# Patient Record
Sex: Female | Born: 1975 | Race: White | Hispanic: No | Marital: Married | Smoking: Current every day smoker
Health system: Southern US, Community
[De-identification: ages and names within clinical notes are randomized; demographics above are authoritative.]

## PROBLEM LIST (undated history)

## (undated) DIAGNOSIS — Z21 Asymptomatic human immunodeficiency virus [HIV] infection status: Secondary | ICD-10-CM

## (undated) DIAGNOSIS — B20 Human immunodeficiency virus [HIV] disease: Secondary | ICD-10-CM

## (undated) DIAGNOSIS — F4310 Post-traumatic stress disorder, unspecified: Secondary | ICD-10-CM

## (undated) DIAGNOSIS — J189 Pneumonia, unspecified organism: Secondary | ICD-10-CM

## (undated) DIAGNOSIS — F909 Attention-deficit hyperactivity disorder, unspecified type: Secondary | ICD-10-CM

## (undated) DIAGNOSIS — F1990 Other psychoactive substance use, unspecified, uncomplicated: Secondary | ICD-10-CM

## (undated) DIAGNOSIS — B1910 Unspecified viral hepatitis B without hepatic coma: Secondary | ICD-10-CM

## (undated) DIAGNOSIS — I10 Essential (primary) hypertension: Secondary | ICD-10-CM

## (undated) DIAGNOSIS — B9689 Other specified bacterial agents as the cause of diseases classified elsewhere: Secondary | ICD-10-CM

---

## 2019-01-15 ENCOUNTER — Encounter (HOSPITAL_COMMUNITY): Payer: Self-pay

## 2019-01-15 ENCOUNTER — Emergency Department (HOSPITAL_COMMUNITY)
Admission: EM | Admit: 2019-01-15 | Discharge: 2019-01-15 | Disposition: A | Discharge status: Home / Self Care | Payer: Medicaid Other | Attending: Emergency Medicine | Admitting: Emergency Medicine

## 2019-01-15 ENCOUNTER — Other Ambulatory Visit: Payer: Self-pay

## 2019-01-15 ENCOUNTER — Emergency Department (HOSPITAL_COMMUNITY): Payer: Medicaid Other

## 2019-01-15 DIAGNOSIS — S62619S Displaced fracture of proximal phalanx of unspecified finger, sequela: Secondary | ICD-10-CM

## 2019-01-15 DIAGNOSIS — S62617S Displaced fracture of proximal phalanx of left little finger, sequela: Secondary | ICD-10-CM | POA: Diagnosis not present

## 2019-01-15 HISTORY — DX: Human immunodeficiency virus (HIV) disease: B20

## 2019-01-15 HISTORY — DX: Asymptomatic human immunodeficiency virus (hiv) infection status: Z21

## 2019-01-15 MED ORDER — IBUPROFEN 400 MG PO TABS
600.0000 mg | ORAL_TABLET | Freq: Once | ORAL | Status: AC
  Administered 2019-01-15: 600 mg via ORAL
  Filled 2019-01-15: qty 1

## 2019-01-15 MED ORDER — OXYCODONE-ACETAMINOPHEN 5-325 MG PO TABS
1.0000 | ORAL_TABLET | Freq: Once | ORAL | Status: AC
  Administered 2019-01-15: 1 via ORAL
  Filled 2019-01-15: qty 1

## 2019-01-15 NOTE — ED Notes (Signed)
Pt discharge instructions and follow up instructions reviewed with the patient. The patient verbalized understanding. Pt discharged.
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
## 2019-01-15 NOTE — Discharge Instructions (Signed)
You are seen in the ER for a recheck of your left hand fracture.  X-ray showed a slightly displaced fracture at the base of your fifth finger.  No other complications.  Orthopedic physician assistant evaluated you in the ER and recommended splinting to keep the fracture stable.  Alternate ibuprofen and acetaminophen for pain.  Keep the extremity elevated.  There is a little bit of skin breakdown in between your fingers which is due to moisture and having the splint on for several days.  Because of this and to prevent an infection, we placed you in a splint that is removable at home so that you can check the skin to make sure that there is no infection developing.  Wear your splint at all times but do keep a close eye on the skin to make sure there is no infection.  Return to the ER if there is increased redness pain swelling pus fevers around the skin, loss of sensation or tingling in your extremity.  Call orthopedic/hand clinic to make an appointment for reevaluation to ensure your fracture is healing appropriately.
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
## 2019-01-15 NOTE — Progress Notes (Signed)
Orthopedic Tech Progress Note Patient Details:  Alyssa Barnett [facility] 22-May-1975 [id number] Had a conversation with the PA about making the splint removable so patient could keep an eye on her hand. She had wet, soft skin in between fingers where she originally had her fingers "buddy taped".[facility] Pas[employer] Devices Type of Ortho Device: Ulna gutter splint Ortho Device/Splint Location: LUE Ortho Device/Splint Interventions: Application, Ordered   Post Interventions Patient Tolerated: Well Instructions Provided: Care of device, Adjustment of device   Janit Pagan 01/15/2019, 4:44 PM
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
## 2019-01-15 NOTE — ED Triage Notes (Signed)
MD at bedside. 

## 2019-10-01 ENCOUNTER — Encounter (HOSPITAL_COMMUNITY): Payer: Self-pay | Admitting: Emergency Medicine

## 2019-10-01 ENCOUNTER — Other Ambulatory Visit: Payer: Self-pay

## 2019-10-01 ENCOUNTER — Emergency Department (HOSPITAL_COMMUNITY): Payer: Medicaid Other

## 2019-10-01 ENCOUNTER — Emergency Department (HOSPITAL_COMMUNITY)
Admission: EM | Admit: 2019-10-01 | Discharge: 2019-10-01 | Discharge status: Against Medical Advice | Payer: Medicaid Other | Attending: Emergency Medicine | Admitting: Emergency Medicine

## 2019-10-01 DIAGNOSIS — F17210 Nicotine dependence, cigarettes, uncomplicated: Secondary | ICD-10-CM | POA: Diagnosis not present

## 2019-10-01 DIAGNOSIS — J189 Pneumonia, unspecified organism: Secondary | ICD-10-CM | POA: Diagnosis not present

## 2019-10-01 DIAGNOSIS — R079 Chest pain, unspecified: Secondary | ICD-10-CM

## 2019-10-01 DIAGNOSIS — R7989 Other specified abnormal findings of blood chemistry: Secondary | ICD-10-CM | POA: Diagnosis not present

## 2019-10-01 DIAGNOSIS — Z20822 Contact with and (suspected) exposure to covid-19: Secondary | ICD-10-CM | POA: Insufficient documentation

## 2019-10-01 LAB — CBC
HCT: 39.4 % (ref 36.0–46.0)
Hemoglobin: 12.1 g/dL (ref 12.0–15.0)
MCH: 26.4 pg (ref 26.0–34.0)
MCHC: 30.7 g/dL (ref 30.0–36.0)
MCV: 86 fL (ref 80.0–100.0)
Platelets: 127 10*3/uL — ABNORMAL LOW (ref 150–400)
RBC: 4.58 MIL/uL (ref 3.87–5.11)
RDW: 14.7 % (ref 11.5–15.5)
WBC: 3.2 10*3/uL — ABNORMAL LOW (ref 4.0–10.5)
nRBC: 0 % (ref 0.0–0.2)

## 2019-10-01 LAB — BASIC METABOLIC PANEL
Anion gap: 9 (ref 5–15)
BUN: 15 mg/dL (ref 6–20)
CO2: 22 mmol/L (ref 22–32)
Calcium: 8.4 mg/dL — ABNORMAL LOW (ref 8.9–10.3)
Chloride: 105 mmol/L (ref 98–111)
Creatinine, Ser: 1.19 mg/dL — ABNORMAL HIGH (ref 0.44–1.00)
GFR calc Af Amer: >60 mL/min (ref >60)
GFR calc non Af Amer: 56 mL/min — ABNORMAL LOW (ref >60)
Glucose, Bld: 128 mg/dL — ABNORMAL HIGH (ref 70–99)
Potassium: 3.8 mmol/L (ref 3.5–5.1)
Sodium: 136 mmol/L (ref 135–145)

## 2019-10-01 LAB — TROPONIN I (HIGH SENSITIVITY)
Troponin I (High Sensitivity): 34 ng/L — ABNORMAL HIGH (ref <18)
Troponin I (High Sensitivity): 45 ng/L — ABNORMAL HIGH (ref <18)

## 2019-10-01 LAB — SARS CORONAVIRUS 2 BY RT PCR (HOSPITAL ORDER, PERFORMED IN ~~LOC~~ HOSPITAL LAB): SARS Coronavirus 2: NEGATIVE

## 2019-10-01 LAB — D-DIMER, QUANTITATIVE: D-Dimer, Quant: 1 ug/mL-FEU — ABNORMAL HIGH (ref 0.00–0.50)

## 2019-10-01 LAB — I-STAT BETA HCG BLOOD, ED (MC, WL, AP ONLY): I-stat hCG, quantitative: <5 m[IU]/mL (ref <5)

## 2019-10-01 MED ORDER — SODIUM CHLORIDE 0.9 % IV SOLN
1.0000 g | Freq: Once | INTRAVENOUS | Status: AC
  Administered 2019-10-01: 1 g via INTRAVENOUS
  Filled 2019-10-01: qty 10

## 2019-10-01 MED ORDER — OXYCODONE-ACETAMINOPHEN 5-325 MG PO TABS
1.0000 | ORAL_TABLET | Freq: Once | ORAL | Status: AC
  Administered 2019-10-01: 1 via ORAL
  Filled 2019-10-01: qty 1

## 2019-10-01 MED ORDER — AZITHROMYCIN 250 MG PO TABS
500.0000 mg | ORAL_TABLET | Freq: Once | ORAL | Status: AC
  Administered 2019-10-01: 500 mg via ORAL
  Filled 2019-10-01: qty 2

## 2019-10-01 MED ORDER — ENOXAPARIN SODIUM 60 MG/0.6ML ~~LOC~~ SOLN
55.0000 mg | Freq: Two times a day (BID) | SUBCUTANEOUS | Status: DC
  Filled 2019-10-01: qty 0.55

## 2019-10-01 MED ORDER — MORPHINE SULFATE (PF) 4 MG/ML IV SOLN
4.0000 mg | Freq: Once | INTRAVENOUS | Status: AC
  Administered 2019-10-01: 4 mg via INTRAVENOUS
  Filled 2019-10-01: qty 1

## 2019-10-01 MED ORDER — HYDROMORPHONE HCL 1 MG/ML IJ SOLN
1.0000 mg | Freq: Once | INTRAMUSCULAR | Status: AC
  Administered 2019-10-01: 1 mg via INTRAVENOUS
  Filled 2019-10-01: qty 1

## 2019-10-01 NOTE — ED Provider Notes (Signed)
[facility] EMERGENCY DEPARTMENT Provider Note   CSN: [id number] Arrival date & time: 10/01/19  5397     History Chief Complaint  Patient presents with  . Chest Pain    Alyssa Barnett is a 44 y.o. female.  HPI   Pt is complaining of pain in her chest and lungs.  It started about a week ago.  Pt thinks she has pneumonia.  SHe has felt feverish.  Has not taken her temp.  Pt is HIV, aids defined.  SHe is not on any medications.  Pt is not sure of recent viral load or cd4 count.    Pt has not been vaccinated for covid.  Pt states the pain in her chest is severe.  It increases with coughing.  Past Medical History:  Diagnosis Date  . HIV (human immunodeficiency virus infection) (HCC)     There are no problems to display for this patient.   History reviewed. No pertinent surgical history.   OB History   No obstetric history on file.     No family history on file.  Social History   Tobacco Use  . Smoking status: Current Every Day Smoker  . Smokeless tobacco: Never Used  Vaping Use  . Vaping Use: Never used  Substance Use Topics  . Alcohol use: Yes  . Drug use: Yes    Types: Marijuana    Home Medications Prior to Admission medications   Not on File    Allergies    Patient has no known allergies.  Review of Systems   Review of Systems  All other systems reviewed and are negative.   Physical Exam Updated Vital Signs BP (!) 136/94 (BP Location: Right Arm)   Pulse 100   Temp (!) 97.2 F (36.2 C) (Oral)   Resp 18   Ht 1.626 m (5\' 4" )   Wt 56.7 kg   SpO2 91%   BMI 21.46 kg/m   Physical Exam Vitals and nursing note reviewed.  Constitutional:      General: She is not in acute distress.    Appearance: She is normal weight.  HENT:     Head: Normocephalic and atraumatic.     Right Ear: External ear normal.     Left Ear: External ear normal.  Eyes:     General: No scleral icterus.       Right eye: No discharge.        Left eye:  No discharge.     Conjunctiva/sclera: Conjunctivae normal.  Neck:     Trachea: No tracheal deviation.  Cardiovascular:     Rate and Rhythm: Normal rate and regular rhythm.  Pulmonary:     Effort: Pulmonary effort is normal. No respiratory distress.     Breath sounds: Normal breath sounds. No stridor. No decreased breath sounds, wheezing or rales.  Chest:     Chest wall: No edema.  Abdominal:     General: Bowel sounds are normal. There is no distension.     Palpations: Abdomen is soft.     Tenderness: There is no abdominal tenderness. There is no guarding or rebound.  Musculoskeletal:        General: No tenderness.     Cervical back: Neck supple.     Right lower leg: No edema.     Left lower leg: No edema.  Skin:    General: Skin is warm and dry.     Findings: No rash.  Neurological:     Mental Status:  She is alert.     Cranial Nerves: No cranial nerve deficit (no facial droop, extraocular movements intact, no slurred speech).     Sensory: No sensory deficit.     Motor: No abnormal muscle tone or seizure activity.     Coordination: Coordination normal.  Psychiatric:        Mood and Affect: Affect is labile and angry.     Comments: Initially did not answer questions, as I spoke louder pt responded but indicated I was speaking too loudly     ED Results / Procedures / Treatments   Labs (all labs ordered are listed, but only abnormal results are displayed) Labs Reviewed  BASIC METABOLIC PANEL - Abnormal; Notable for the following components:      Result Value   Glucose, Bld 128 (*)    Creatinine, Ser 1.19 (*)    Calcium 8.4 (*)    GFR calc non Af Amer 56 (*)    All other components within normal limits  CBC - Abnormal; Notable for the following components:   WBC 3.2 (*)    Platelets 127 (*)    All other components within normal limits  D-DIMER, QUANTITATIVE (NOT AT [facility]) - Abnormal; Notable for the following components:   D-Dimer, Quant 1.00 (*)    All other components  within normal limits  TROPONIN I (HIGH SENSITIVITY) - Abnormal; Notable for the following components:   Troponin I (High Sensitivity) 34 (*)    All other components within normal limits  TROPONIN I (HIGH SENSITIVITY) - Abnormal; Notable for the following components:   Troponin I (High Sensitivity) 45 (*)    All other components within normal limits  SARS CORONAVIRUS 2 BY RT PCR (HOSPITAL ORDER, PERFORMED IN [facility])  I-STAT BETA HCG BLOOD, ED ([facility], [facility], [facility] ONLY)    EKG EKG Interpretation  Date/Time:  Monday October 01 2019 11:42:25 EDT Ventricular Rate:  91 PR Interval:    QRS Duration: 100 QT Interval:  405 QTC Calculation: 499 R Axis:   101 Text Interpretation: Sinus rhythm Atrial premature complex Probable left atrial enlargement Right axis deviation Abnormal lateral Q waves Minimal ST depression, lateral leads No previous tracing Confirmed by Linwood Dibbles [phone]) on 10/01/2019 12:10:34 PM   Radiology DG Chest 2 View  Result Date: 10/01/2019 CLINICAL DATA:  Chest wall pain, confusion EXAM: CHEST - 2 VIEW COMPARISON:  None. FINDINGS: Mild enlargement of the cardiopericardial silhouette. Normal mediastinal contour. No pneumothorax. No pleural effusion. No overt pulmonary edema. Patchy hazy and reticular opacities in the lower lungs bilaterally. IMPRESSION: 1. Patchy hazy and reticular opacities in the lower lungs bilaterally, suspect nonspecific scarring, cannot exclude superimposed atypical infection. 2. Mild enlargement of the cardiopericardial silhouette. Electronically Signed   By: Delbert Phenix M.D.   On: 10/01/2019 09:16    Procedures Procedures (including critical care time)  Medications Ordered in ED Medications  enoxaparin (LOVENOX) injection 55 mg (has no administration in time range)  oxyCODONE-acetaminophen (PERCOCET/ROXICET) 5-325 MG per tablet 1 tablet (1 tablet Oral Given 10/01/19 1117)  cefTRIAXone (ROCEPHIN) 1 g in sodium chloride 0.9 % 100 mL IVPB (0 g  Intravenous Stopped 10/01/19 1315)  azithromycin (ZITHROMAX) tablet 500 mg (500 mg Oral Given 10/01/19 1229)  morphine 4 MG/ML injection 4 mg (4 mg Intravenous Given 10/01/19 1256)  HYDROmorphone (DILAUDID) injection 1 mg (1 mg Intravenous Given 10/01/19 1447)    ED Course  I have reviewed the triage vital signs and the nursing notes.  Pertinent  labs & imaging results that were available during my care of the patient were reviewed by me and considered in my medical decision making (see chart for details).  Clinical Course as of Oct 01 1543  [facility] Oct 01, 2019  1051 Labs reviewed.  Patient does have an elevated troponin at 34.  Symptoms are not typical for ACS.  12-week peak troponin check a D-dimer.   [JK]  1052 Chest x-ray does findings suggestive of possible atypical infection.   [JK]  1211 Notified of decreased o2 sat.  Pt started on [place]   [JK]  1326 D-dimer elevated at 1.  I will order a CT angiogram   [JK]  1445 Pt would not lie still for CT scan.  Pt started yelling at me when I tried to discuss her CT scan.  Pt eventually calmed down and agrees to try after additional pain medication   [JK]  1446 Patient was unable to lie still for the CT scan even after being given additional pain medications   [JK]  1535 I recommended admission to the hospital.  However patient states she has to go to her hotel room to pick up her stuff.  She does not want to be admitted now but states she will come back after she gets her stuff from the hotel.  Patient called her father to get some money so she could go to the hotel and I remained at the bedside to speak to her father about [place] wanting her to be admitted to the hospital   [JK]    Clinical Course User Index [JK] Linwood Dibbles, MD   MDM Rules/Calculators/A&P                          Patient presented to ED with complaints of chest pain and she was concerned she had pneumonia.  Patient does have HIV disease and has not been compliant with an HIV  regimen.  Patient's Covid test is negative.  She did have an elevated D-dimer and at times has required oxygen in the ED.  I was concerned about the possibility of pulmonary embolism.  I ordered CT angiogram.  Patient was unable to lie still despite IV narcotics.  I recommend admitting the patient to the hospital for further treatment.  Patient is adamant that she cannot stay right now.  All her possessions are in her motel room and she needs to go pick them up.  Patient states she will return as soon as she does that.  I will give the patient a dose of Lovenox to cover since she will not stay for admission at this time.  Patient is alert and awake and answering questions appropriately.  She understands the risks and dangers.  She will sign out AMA but plans on returning Final Clinical Impression(s) / ED Diagnoses Final diagnoses:  Community acquired pneumonia, unspecified laterality  Chest pain, unspecified type  Positive D dimer    Rx / DC Orders ED Discharge Orders    None       Linwood Dibbles, MD 10/01/19 1546

## 2019-10-01 NOTE — ED Notes (Signed)
Attempted to do EKG, pt moving around too much, pt yelling out in pain at this time, unable to put pt on cardiac monitor, insert IV, or obtain COVID swab

## 2019-10-01 NOTE — ED Triage Notes (Signed)
To ED via [facility] from [facility] on [address]- c/o chest pain that has been going on for A MONTH - states "just can't take it anymore. Rocking back and forth in chair.

## 2019-10-01 NOTE — ED Notes (Signed)
Pt states she is unable to stay at this time, needs to organize her belongings/living situation. States she understands that she needs to be admitted and will return. Pt ordered [employer] and left AMA. Provider aware.

## 2019-10-01 NOTE — Discharge Instructions (Signed)
Return to the ED to have your CT scan and possibly be admitted to the hospital

## 2019-10-01 NOTE — ED Notes (Signed)
Pt unable to lay flat for CT scan. Provider aware.

## 2019-10-04 ENCOUNTER — Emergency Department (HOSPITAL_COMMUNITY)
Admission: EM | Admit: 2019-10-04 | Discharge: 2019-10-04 | Disposition: A | Discharge status: Home / Self Care | Payer: Medicaid Other | Attending: Emergency Medicine | Admitting: Emergency Medicine

## 2019-10-04 ENCOUNTER — Emergency Department (HOSPITAL_COMMUNITY): Payer: Medicaid Other

## 2019-10-04 ENCOUNTER — Other Ambulatory Visit: Payer: Self-pay

## 2019-10-04 ENCOUNTER — Encounter (HOSPITAL_COMMUNITY): Payer: Self-pay

## 2019-10-04 DIAGNOSIS — R0602 Shortness of breath: Secondary | ICD-10-CM | POA: Diagnosis not present

## 2019-10-04 DIAGNOSIS — Z5321 Procedure and treatment not carried out due to patient leaving prior to being seen by health care provider: Secondary | ICD-10-CM | POA: Insufficient documentation

## 2019-10-04 DIAGNOSIS — R079 Chest pain, unspecified: Secondary | ICD-10-CM | POA: Insufficient documentation

## 2019-10-04 LAB — CBC
HCT: 37.4 % (ref 36.0–46.0)
Hemoglobin: 11.4 g/dL — ABNORMAL LOW (ref 12.0–15.0)
MCH: 26.5 pg (ref 26.0–34.0)
MCHC: 30.5 g/dL (ref 30.0–36.0)
MCV: 87 fL (ref 80.0–100.0)
Platelets: 122 10*3/uL — ABNORMAL LOW (ref 150–400)
RBC: 4.3 MIL/uL (ref 3.87–5.11)
RDW: 14.6 % (ref 11.5–15.5)
WBC: 2.5 10*3/uL — ABNORMAL LOW (ref 4.0–10.5)
nRBC: 0 % (ref 0.0–0.2)

## 2019-10-04 LAB — BASIC METABOLIC PANEL
Anion gap: 7 (ref 5–15)
BUN: 14 mg/dL (ref 6–20)
CO2: 23 mmol/L (ref 22–32)
Calcium: 8.2 mg/dL — ABNORMAL LOW (ref 8.9–10.3)
Chloride: 103 mmol/L (ref 98–111)
Creatinine, Ser: 1.26 mg/dL — ABNORMAL HIGH (ref 0.44–1.00)
GFR calc Af Amer: >60 mL/min (ref >60)
GFR calc non Af Amer: 53 mL/min — ABNORMAL LOW (ref >60)
Glucose, Bld: 153 mg/dL — ABNORMAL HIGH (ref 70–99)
Potassium: 3.7 mmol/L (ref 3.5–5.1)
Sodium: 133 mmol/L — ABNORMAL LOW (ref 135–145)

## 2019-10-04 LAB — TROPONIN I (HIGH SENSITIVITY): Troponin I (High Sensitivity): 31 ng/L — ABNORMAL HIGH (ref <18)

## 2019-10-04 LAB — I-STAT BETA HCG BLOOD, ED (MC, WL, AP ONLY): I-stat hCG, quantitative: <5 m[IU]/mL (ref <5)

## 2019-10-04 NOTE — ED Triage Notes (Addendum)
Pt presents witgh CP, states she was just here and dx with "walking pneumonia" also reports she was supposed to be admitted 2 days ago when for the same. Pt states she couldn't stay d/t "living situation" until today.   Pt states she "last smoked crack and smoked weed 2 days ago"

## 2019-10-04 NOTE — ED Notes (Addendum)
Lobby assistant, Derinda C, RTR approached pt to check VS which patient refused stating she was leaving. Pt left with her belongings bag. Pt stated intention to receive treatment at another local hospital.

## 2019-10-11 ENCOUNTER — Emergency Department (HOSPITAL_COMMUNITY)
Admission: EM | Admit: 2019-10-11 | Discharge: 2019-10-12 | Disposition: A | Discharge status: Home / Self Care | Payer: Medicaid Other | Attending: Emergency Medicine | Admitting: Emergency Medicine

## 2019-10-11 ENCOUNTER — Encounter (HOSPITAL_COMMUNITY): Payer: Self-pay

## 2019-10-11 DIAGNOSIS — R109 Unspecified abdominal pain: Secondary | ICD-10-CM | POA: Insufficient documentation

## 2019-10-11 DIAGNOSIS — Z5321 Procedure and treatment not carried out due to patient leaving prior to being seen by health care provider: Secondary | ICD-10-CM | POA: Diagnosis not present

## 2019-10-11 NOTE — ED Triage Notes (Signed)
Pt arrives to ED via [facility] w/ c/o bilat flank pain that started today. Pt denies urinary symptoms, denies fever. EMS initially called out for resp distress, resp e/u, pt able to speak in complete sentences. Pt reports dx of walking PNA on 8/30. Probable substance abuse per EMS, w/ hx of crack/cocaine use. EMS VSS.

## 2019-10-11 NOTE — ED Notes (Signed)
Refuses VS assessment. States she is leaving.

## 2019-10-11 NOTE — ED Notes (Signed)
Pt refusing blood work in triage on account that she was told she could not lay on the floor in the triage room.

## 2019-10-16 ENCOUNTER — Other Ambulatory Visit: Payer: Self-pay

## 2019-10-16 ENCOUNTER — Inpatient Hospital Stay (HOSPITAL_COMMUNITY): Payer: Medicaid Other

## 2019-10-16 ENCOUNTER — Emergency Department (HOSPITAL_COMMUNITY): Payer: Medicaid Other

## 2019-10-16 ENCOUNTER — Inpatient Hospital Stay (HOSPITAL_COMMUNITY)
Admission: EM | Admit: 2019-10-16 | Discharge: 2019-10-22 | DRG: 974 | Disposition: A | Discharge status: Home / Self Care | Payer: Medicaid Other | Attending: Internal Medicine | Admitting: Internal Medicine

## 2019-10-16 ENCOUNTER — Encounter (HOSPITAL_COMMUNITY): Payer: Self-pay | Admitting: Emergency Medicine

## 2019-10-16 DIAGNOSIS — I501 Left ventricular failure: Secondary | ICD-10-CM | POA: Diagnosis not present

## 2019-10-16 DIAGNOSIS — I110 Hypertensive heart disease with heart failure: Secondary | ICD-10-CM | POA: Diagnosis present

## 2019-10-16 DIAGNOSIS — W19XXXA Unspecified fall, initial encounter: Secondary | ICD-10-CM

## 2019-10-16 DIAGNOSIS — Z8701 Personal history of pneumonia (recurrent): Secondary | ICD-10-CM

## 2019-10-16 DIAGNOSIS — R188 Other ascites: Secondary | ICD-10-CM | POA: Diagnosis present

## 2019-10-16 DIAGNOSIS — F1423 Cocaine dependence with withdrawal: Secondary | ICD-10-CM | POA: Diagnosis present

## 2019-10-16 DIAGNOSIS — Z20822 Contact with and (suspected) exposure to covid-19: Secondary | ICD-10-CM | POA: Diagnosis present

## 2019-10-16 DIAGNOSIS — J17 Pneumonia in diseases classified elsewhere: Secondary | ICD-10-CM | POA: Diagnosis present

## 2019-10-16 DIAGNOSIS — I5082 Biventricular heart failure: Secondary | ICD-10-CM | POA: Diagnosis present

## 2019-10-16 DIAGNOSIS — I509 Heart failure, unspecified: Secondary | ICD-10-CM

## 2019-10-16 DIAGNOSIS — F1910 Other psychoactive substance abuse, uncomplicated: Secondary | ICD-10-CM | POA: Diagnosis present

## 2019-10-16 DIAGNOSIS — F419 Anxiety disorder, unspecified: Secondary | ICD-10-CM | POA: Diagnosis present

## 2019-10-16 DIAGNOSIS — I10 Essential (primary) hypertension: Secondary | ICD-10-CM

## 2019-10-16 DIAGNOSIS — E871 Hypo-osmolality and hyponatremia: Secondary | ICD-10-CM | POA: Diagnosis present

## 2019-10-16 DIAGNOSIS — Z590 Homelessness: Secondary | ICD-10-CM

## 2019-10-16 DIAGNOSIS — R0902 Hypoxemia: Secondary | ICD-10-CM | POA: Diagnosis present

## 2019-10-16 DIAGNOSIS — J159 Unspecified bacterial pneumonia: Secondary | ICD-10-CM | POA: Diagnosis present

## 2019-10-16 DIAGNOSIS — Z9114 Patient's other noncompliance with medication regimen: Secondary | ICD-10-CM

## 2019-10-16 DIAGNOSIS — I5041 Acute combined systolic (congestive) and diastolic (congestive) heart failure: Secondary | ICD-10-CM | POA: Diagnosis present

## 2019-10-16 DIAGNOSIS — S0010XA Contusion of unspecified eyelid and periocular area, initial encounter: Secondary | ICD-10-CM | POA: Diagnosis present

## 2019-10-16 DIAGNOSIS — F1410 Cocaine abuse, uncomplicated: Secondary | ICD-10-CM | POA: Diagnosis present

## 2019-10-16 DIAGNOSIS — F1210 Cannabis abuse, uncomplicated: Secondary | ICD-10-CM | POA: Diagnosis present

## 2019-10-16 DIAGNOSIS — Z888 Allergy status to other drugs, medicaments and biological substances status: Secondary | ICD-10-CM

## 2019-10-16 DIAGNOSIS — N179 Acute kidney failure, unspecified: Secondary | ICD-10-CM | POA: Diagnosis present

## 2019-10-16 DIAGNOSIS — B20 Human immunodeficiency virus [HIV] disease: Principal | ICD-10-CM | POA: Diagnosis present

## 2019-10-16 DIAGNOSIS — F17200 Nicotine dependence, unspecified, uncomplicated: Secondary | ICD-10-CM | POA: Diagnosis present

## 2019-10-16 DIAGNOSIS — W19XXXD Unspecified fall, subsequent encounter: Secondary | ICD-10-CM | POA: Diagnosis not present

## 2019-10-16 DIAGNOSIS — Z833 Family history of diabetes mellitus: Secondary | ICD-10-CM

## 2019-10-16 DIAGNOSIS — Z8249 Family history of ischemic heart disease and other diseases of the circulatory system: Secondary | ICD-10-CM | POA: Diagnosis not present

## 2019-10-16 DIAGNOSIS — R1900 Intra-abdominal and pelvic swelling, mass and lump, unspecified site: Secondary | ICD-10-CM | POA: Diagnosis present

## 2019-10-16 DIAGNOSIS — I313 Pericardial effusion (noninflammatory): Secondary | ICD-10-CM | POA: Diagnosis present

## 2019-10-16 DIAGNOSIS — I427 Cardiomyopathy due to drug and external agent: Secondary | ICD-10-CM | POA: Diagnosis present

## 2019-10-16 DIAGNOSIS — B1910 Unspecified viral hepatitis B without hepatic coma: Secondary | ICD-10-CM | POA: Diagnosis present

## 2019-10-16 DIAGNOSIS — J189 Pneumonia, unspecified organism: Secondary | ICD-10-CM

## 2019-10-16 LAB — URINALYSIS, ROUTINE W REFLEX MICROSCOPIC
Bacteria, UA: NONE SEEN
Bilirubin Urine: NEGATIVE
Glucose, UA: NEGATIVE mg/dL
Ketones, ur: NEGATIVE mg/dL
Leukocytes,Ua: NEGATIVE
Nitrite: NEGATIVE
Protein, ur: 100 mg/dL — AB
Specific Gravity, Urine: 1.009 (ref 1.005–1.030)
pH: 6 (ref 5.0–8.0)

## 2019-10-16 LAB — I-STAT BETA HCG BLOOD, ED (MC, WL, AP ONLY): I-stat hCG, quantitative: <5 m[IU]/mL (ref <5)

## 2019-10-16 LAB — RAPID URINE DRUG SCREEN, HOSP PERFORMED
Amphetamines: NOT DETECTED
Barbiturates: NOT DETECTED
Benzodiazepines: NOT DETECTED
Cocaine: POSITIVE — AB
Opiates: NOT DETECTED
Tetrahydrocannabinol: POSITIVE — AB

## 2019-10-16 LAB — COMPREHENSIVE METABOLIC PANEL
ALT: 22 U/L (ref 0–44)
AST: 62 U/L — ABNORMAL HIGH (ref 15–41)
Albumin: 2.8 g/dL — ABNORMAL LOW (ref 3.5–5.0)
Alkaline Phosphatase: 54 U/L (ref 38–126)
Anion gap: 11 (ref 5–15)
BUN: 15 mg/dL (ref 6–20)
CO2: 19 mmol/L — ABNORMAL LOW (ref 22–32)
Calcium: 8.5 mg/dL — ABNORMAL LOW (ref 8.9–10.3)
Chloride: 98 mmol/L (ref 98–111)
Creatinine, Ser: 1.08 mg/dL — ABNORMAL HIGH (ref 0.44–1.00)
GFR calc Af Amer: >60 mL/min (ref >60)
GFR calc non Af Amer: >60 mL/min (ref >60)
Glucose, Bld: 134 mg/dL — ABNORMAL HIGH (ref 70–99)
Potassium: 5.7 mmol/L — ABNORMAL HIGH (ref 3.5–5.1)
Sodium: 128 mmol/L — ABNORMAL LOW (ref 135–145)
Total Bilirubin: 0.8 mg/dL (ref 0.3–1.2)
Total Protein: 6.8 g/dL (ref 6.5–8.1)

## 2019-10-16 LAB — SARS CORONAVIRUS 2 BY RT PCR (HOSPITAL ORDER, PERFORMED IN ~~LOC~~ HOSPITAL LAB): SARS Coronavirus 2: NEGATIVE

## 2019-10-16 LAB — TRIGLYCERIDES: Triglycerides: 103 mg/dL (ref <150)

## 2019-10-16 LAB — CBC
HCT: 39.4 % (ref 36.0–46.0)
Hemoglobin: 12.6 g/dL (ref 12.0–15.0)
MCH: 26.1 pg (ref 26.0–34.0)
MCHC: 32 g/dL (ref 30.0–36.0)
MCV: 81.6 fL (ref 80.0–100.0)
Platelets: 173 10*3/uL (ref 150–400)
RBC: 4.83 MIL/uL (ref 3.87–5.11)
RDW: 14.1 % (ref 11.5–15.5)
WBC: 5.3 10*3/uL (ref 4.0–10.5)
nRBC: 0 % (ref 0.0–0.2)

## 2019-10-16 LAB — LACTATE DEHYDROGENASE: LDH: 219 U/L — ABNORMAL HIGH (ref 98–192)

## 2019-10-16 LAB — FIBRINOGEN: Fibrinogen: 283 mg/dL (ref 210–475)

## 2019-10-16 LAB — D-DIMER, QUANTITATIVE: D-Dimer, Quant: 2.58 ug/mL-FEU — ABNORMAL HIGH (ref 0.00–0.50)

## 2019-10-16 LAB — STREP PNEUMONIAE URINARY ANTIGEN: Strep Pneumo Urinary Antigen: NEGATIVE

## 2019-10-16 LAB — C-REACTIVE PROTEIN: CRP: <0.5 mg/dL (ref <1.0)

## 2019-10-16 LAB — LACTIC ACID, PLASMA: Lactic Acid, Venous: 1.3 mmol/L (ref 0.5–1.9)

## 2019-10-16 LAB — PROCALCITONIN: Procalcitonin: <0.1 ng/mL

## 2019-10-16 LAB — BRAIN NATRIURETIC PEPTIDE: B Natriuretic Peptide: 1929.2 pg/mL — ABNORMAL HIGH (ref 0.0–100.0)

## 2019-10-16 LAB — FERRITIN: Ferritin: 25 ng/mL (ref 11–307)

## 2019-10-16 LAB — LIPASE, BLOOD: Lipase: 26 U/L (ref 11–51)

## 2019-10-16 MED ORDER — FUROSEMIDE 10 MG/ML IJ SOLN
40.0000 mg | Freq: Once | INTRAMUSCULAR | Status: AC
  Administered 2019-10-16: 40 mg via INTRAVENOUS
  Filled 2019-10-16: qty 4

## 2019-10-16 MED ORDER — LACTATED RINGERS IV SOLN: INTRAVENOUS | Status: DC

## 2019-10-16 MED ORDER — SODIUM CHLORIDE 0.9 % IV SOLN
2.0000 g | INTRAVENOUS | Status: DC
  Administered 2019-10-16: 2 g via INTRAVENOUS
  Filled 2019-10-16: qty 20

## 2019-10-16 MED ORDER — ONDANSETRON 4 MG PO TBDP
4.0000 mg | ORAL_TABLET | Freq: Four times a day (QID) | ORAL | Status: AC | PRN
  Administered 2019-10-20: 4 mg via ORAL
  Filled 2019-10-16: qty 1

## 2019-10-16 MED ORDER — ASPIRIN EC 81 MG PO TBEC
81.0000 mg | DELAYED_RELEASE_TABLET | Freq: Every day | ORAL | Status: DC
  Administered 2019-10-17 – 2019-10-22 (×4): 81 mg via ORAL
  Filled 2019-10-16 (×5): qty 1

## 2019-10-16 MED ORDER — FUROSEMIDE 10 MG/ML IJ SOLN
40.0000 mg | Freq: Two times a day (BID) | INTRAMUSCULAR | Status: DC
  Administered 2019-10-16 – 2019-10-17 (×3): 40 mg via INTRAVENOUS
  Filled 2019-10-16 (×3): qty 4

## 2019-10-16 MED ORDER — NICOTINE 14 MG/24HR TD PT24
14.0000 mg | MEDICATED_PATCH | Freq: Every day | TRANSDERMAL | Status: DC
  Administered 2019-10-16 – 2019-10-22 (×7): 14 mg via TRANSDERMAL
  Filled 2019-10-16 (×7): qty 1

## 2019-10-16 MED ORDER — SODIUM CHLORIDE 0.9 % IV SOLN: 250.0000 mL | INTRAVENOUS | Status: DC | PRN

## 2019-10-16 MED ORDER — LOPERAMIDE HCL 2 MG PO CAPS: 2.0000 mg | ORAL_CAPSULE | ORAL | Status: AC | PRN

## 2019-10-16 MED ORDER — ENOXAPARIN SODIUM 60 MG/0.6ML ~~LOC~~ SOLN
1.0000 mg/kg | Freq: Two times a day (BID) | SUBCUTANEOUS | Status: DC
  Filled 2019-10-16 (×3): qty 0.57

## 2019-10-16 MED ORDER — HYDROXYZINE HCL 25 MG PO TABS
25.0000 mg | ORAL_TABLET | Freq: Four times a day (QID) | ORAL | Status: AC | PRN
  Administered 2019-10-16 – 2019-10-20 (×2): 25 mg via ORAL
  Filled 2019-10-16 (×3): qty 1

## 2019-10-16 MED ORDER — METHOCARBAMOL 500 MG PO TABS
500.0000 mg | ORAL_TABLET | Freq: Three times a day (TID) | ORAL | Status: AC | PRN
  Administered 2019-10-17: 500 mg via ORAL
  Filled 2019-10-16 (×2): qty 1

## 2019-10-16 MED ORDER — SODIUM CHLORIDE 0.9% FLUSH
3.0000 mL | INTRAVENOUS | Status: DC | PRN
  Administered 2019-10-20 – 2019-10-21 (×3): 3 mL via INTRAVENOUS

## 2019-10-16 MED ORDER — CLONIDINE HCL 0.1 MG PO TABS
0.1000 mg | ORAL_TABLET | Freq: Every day | ORAL | Status: AC
  Administered 2019-10-21 – 2019-10-22 (×2): 0.1 mg via ORAL
  Filled 2019-10-16 (×2): qty 1

## 2019-10-16 MED ORDER — SODIUM CHLORIDE 0.9 % IV SOLN
500.0000 mg | INTRAVENOUS | Status: DC
  Administered 2019-10-16: 500 mg via INTRAVENOUS
  Filled 2019-10-16: qty 500

## 2019-10-16 MED ORDER — CLONIDINE HCL 0.1 MG PO TABS
0.1000 mg | ORAL_TABLET | Freq: Four times a day (QID) | ORAL | Status: AC
  Administered 2019-10-16 – 2019-10-18 (×8): 0.1 mg via ORAL
  Filled 2019-10-16 (×10): qty 1

## 2019-10-16 MED ORDER — SODIUM CHLORIDE 0.9% FLUSH
3.0000 mL | Freq: Two times a day (BID) | INTRAVENOUS | Status: DC
  Administered 2019-10-16 – 2019-10-21 (×10): 3 mL via INTRAVENOUS

## 2019-10-16 MED ORDER — CLONIDINE HCL 0.1 MG PO TABS
0.1000 mg | ORAL_TABLET | ORAL | Status: AC
  Administered 2019-10-19 – 2019-10-20 (×2): 0.1 mg via ORAL
  Filled 2019-10-16 (×3): qty 1

## 2019-10-16 MED ORDER — IOHEXOL 350 MG/ML SOLN
100.0000 mL | Freq: Once | INTRAVENOUS | Status: AC | PRN
  Administered 2019-10-17: 66 mL via INTRAVENOUS

## 2019-10-16 MED ORDER — KETOROLAC TROMETHAMINE 30 MG/ML IJ SOLN
30.0000 mg | Freq: Four times a day (QID) | INTRAMUSCULAR | Status: DC | PRN
  Administered 2019-10-16 – 2019-10-18 (×4): 30 mg via INTRAVENOUS
  Filled 2019-10-16 (×5): qty 1

## 2019-10-16 MED ORDER — ENOXAPARIN SODIUM 40 MG/0.4ML ~~LOC~~ SOLN: 40.0000 mg | SUBCUTANEOUS | Status: DC

## 2019-10-16 MED ORDER — DICYCLOMINE HCL 20 MG PO TABS
20.0000 mg | ORAL_TABLET | Freq: Four times a day (QID) | ORAL | Status: AC | PRN
  Filled 2019-10-16: qty 1

## 2019-10-16 NOTE — ED Notes (Signed)
Called phlebotomy to try to get labs on this pt. They said they have a few others to do and will come as soon as they can to try to get blood.

## 2019-10-16 NOTE — ED Notes (Signed)
Pt refused CT and doctor has been made aware.

## 2019-10-16 NOTE — ED Triage Notes (Addendum)
Per EMS was at [facility] yesterday for same symptoms-diagnosed with PNA on Friday, left AMA-has not been taking BP meds-patient states her "intestines" hurt-productive cough, yellow sputum-covid neg-per EMS, states probable crack use-history of HIV

## 2019-10-16 NOTE — ED Notes (Signed)
After screaming saying she had to urinate pt was offered the bedpan and said "I can't pee." Shortly after pt stood beside the bed, removed all her monitors/cords, and peed all over the floor. She then yelled to say "I peed on the floor." Pt has been cleaned up. Pt's call bell is at the bedside.
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
## 2019-10-16 NOTE — ED Notes (Signed)
Patient has taken off BP cuff/ pulse/ leads refused to wear

## 2019-10-16 NOTE — ED Notes (Addendum)
Attempted to get lab work from patient. IV will not pull and patient is refusing to be stuck for blood. Blood that was pulled hemolyzed before it could be put into tubes.

## 2019-10-16 NOTE — H&P (Signed)
History and Physical    Alyssa Barnett/[facility]ett JJK:[id number] DOB: 10-21-1975 DOA: 10/16/2019  PCP: Patient, No Pcp Per Consultants:  Jetty Peeks - ID Patient coming from: Home; NOK: None  Chief Complaint: chest pain  HPI: Alyssa Barnett is a 44 y.o. female with medical history significant of untreated HIV presenting with SOB.  She was seen in the ER on 8/31 with chest discomfort.  CXR with ?atypical infection, decreased O2 sat and started on [place] O2.  Elevated D-dimer, unable to tolerate CT.  Left AMA.  She returns with c/o pleuritic chest pain.  At the time of my evaluation, she was lying curled up and refused to answer questions other than to say "pain" repeatedly and intermittently say that her chest hurt.  She periodically screams out for the nurse due to pain.  She has h/o HIV and does not take medications for this.  She uses cocaine.  +productive cough.      ED Course:  Untreated AMA, PNA vs. CHF.  COVID negative.  Given antibiotics, Lasix for a combination of both CHF and PNA.  Review of Systems:  Unable to perform  Ambulatory Status:  Ambulates without assistance  COVID Vaccine Status:  None  Past Medical History:  Diagnosis Date  . HIV (human immunodeficiency virus infection) (HCC)     History reviewed. No pertinent surgical history.  Social History   Socioeconomic History  . Marital status: Married    Spouse name: Not on file  . Number of children: Not on file  . Years of education: Not on file  . Highest education level: Not on file  Occupational History  . Not on file  Tobacco Use  . Smoking status: Current Every Day Smoker  . Smokeless tobacco: Never Used  Vaping Use  . Vaping Use: Never used  Substance and Sexual Activity  . Alcohol use: Yes  . Drug use: Yes    Types: Marijuana, Cocaine    Comment: crack   . Sexual activity: Not on file  Other Topics Concern  . Not on file  Social History Narrative  . Not on file   Social Determinants of Health    Financial Resource Strain:   . Difficulty of Paying Living Expenses: Not on file  Food Insecurity:   . Worried About Programme researcher, broadcasting/film/video in the Last Year: Not on file  . Ran Out of Food in the Last Year: Not on file  Transportation Needs:   . Lack of Transportation (Medical): Not on file  . Lack of Transportation (Non-Medical): Not on file  Physical Activity:   . Days of Exercise per Week: Not on file  . Minutes of Exercise per Session: Not on file  Stress:   . Feeling of Stress : Not on file  Social Connections:   . Frequency of Communication with Friends and Family: Not on file  . Frequency of Social Gatherings with Friends and Family: Not on file  . Attends Religious Services: Not on file  . Active Member of Clubs or Organizations: Not on file  . Attends Banker Meetings: Not on file  . Marital Status: Not on file  Intimate Partner Violence:   . Fear of Current or Ex-Partner: Not on file  . Emotionally Abused: Not on file  . Physically Abused: Not on file  . Sexually Abused: Not on file    Allergies  Allergen Reactions  . Cefaclor Itching    Family History  Problem Relation Age of Onset  .  Diabetes Father   . CAD Father     Prior to Admission medications   Not on File    Physical Exam: Vitals:   10/16/19 1117 10/16/19 1330 10/16/19 1440 10/16/19 1609  BP: (!) 151/126 (!) 159/114 (!) 154/100 (!) 163/126  Pulse: 95 92 72 80  Resp: (!) 22 (!) 31 (!) 21 20  Temp: 97.7 F (36.5 C) 97.7 F (36.5 C) 97.7 F (36.5 C) 97.7 F (36.5 C)  TempSrc: Oral Oral Oral Oral  SpO2:  94% 93% 94%     . General:  Appears withdrawn and like she is in withdrawal but not acutely ill; +piloerection . Eyes:  normal lids, eyes closed throughout evaluation . ENT:  grossly normal hearing, lips & tongue, mmm . Neck:  no LAD, masses or thyromegaly . Cardiovascular:  RRR, no m/r/g. No LE edema.  Marland Kitchen Respiratory:   CTA bilaterally with no wheezes/rales/rhonchi.  Mildly  increased respiratory effort. . Abdomen:  soft, NT, ND, NABS . Skin:  no rash or induration seen on limited exam . Musculoskeletal:  grossly normal tone BUE/BLE, good ROM, no bony abnormality . Psychiatric:  agitated mood and affect, speech limited . Neurologic:  Unable to perform    Radiological Exams on Admission: DG Chest 2 View  Result Date: 10/16/2019 CLINICAL DATA:  Pneumonia, productive cough. EXAM: CHEST - 2 VIEW COMPARISON:  10/04/2019 FINDINGS: Stable top-normal heart size. Worsening bilateral lower lobe no filtrates with associated small bilateral pleural effusions. Possible overlying mild pulmonary interstitial edema. No pneumothorax. IMPRESSION: Worsening bilateral lower lobe infiltrates with associated small bilateral pleural effusions. Possible mild overlying pulmonary interstitial edema. Electronically Signed   By: Irish Lack M.D.   On: 10/16/2019 11:33    EKG: Independently reviewed.  NSR with rate 72; nonspecific ST changes with no evidence of acute ischemia   Labs on Admission: I have personally reviewed the available labs and imaging studies at the time of the admission.  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 AIDS with possible acute PNA -Has been seen at [facility], with last visit in 07/2019 -Quant at that time was 171,000 -Also with h/o Hep B -Presenting with pleuritic CP and productive cough -She was previously seen in the ER for this issue on 8/30, with decreased O2 sats and CXR suggestive of atypical infection -She was unable to  get CT at that time due to inability to lie flat and left AMA -Imaging today suggestive of worsening infiltrates with possible overlying edema -Will admit -Continue Rocephin and Azithro for now -Repeat viral load, CD4 cough, quantiferon gold, strep, legionella testing ordered -ID consult in AM -Concern for PE given markedly elevated D-dimer, CTA requested to be added by EDP - will cover with treatment-dose Lovenox pending CT results  Concern for new-onset CHF -Patient without known h/o CHF presenting with worsening SOB and cough -CXR consistent with possible superimposed pulmonary edema -Significantly elevated BNP -With elevated BNP and abnl CXR, acute decompensated CHF seems possible as diagnosis - either in conjunction with PNA or separately -Will request echocardiogram -CHF order set utilized; may need CHF team  consult but will hold until Echo results are available -Was given Lasix 40 mg x 1 in ER and will repeat with 40 mg IV BID -prn [place] O2 for now -Normal kidney function at this time, will follow -Repeat EKG in AM -Will check troponin since cocaine-induced ischemia is a consideration  Polysubstance abuse -Behavior is highly suggestive of opiate dependence with withdrawal -Will monitor on COWS protocol -prn orders from the Clonidine withdrawal order set were also ordered. -Pain control with Toradol -She has a h/o leaving AMA and appears to be at risk for leaving AMA again  Tobacco dependence -Encourage cessation.   -This was discussed with the patient and should be reviewed on an ongoing basis.   -Patch ordered at patient request.    Note: This patient has been tested and is negative for the novel coronavirus COVID-19.   DVT prophylaxis: Lovenox for empiric PE coverage for now Code Status:  Full Family Communication: None present Disposition Plan:  The patient is from: home  Anticipated d/c is to: home, possible with [facility] services  Anticipated d/c date will depend on  clinical response to treatment, likely 2-4 days  Patient is currently: acutely ill Consults called:  TOC team; PT Admission status: Admit - It is my clinical opinion that admission to INPATIENT is reasonable and necessary because this patient will require at least 2 midnights in the hospital to treat this condition based on the medical complexity of the problems presented.  Given the aforementioned information, the predictability of an adverse outcome is felt to be significant.    Jonah Blue MD [facility]   How to contact the [facility] Attending or Consulting provider 7A - 7P or covering provider during after hours 7P -7A, for this patient?  1. Check the care team in [facility] and look for a) attending/consulting TR[facility] provider listed and b) the [facility] team listed 2. Log into [email or web address] and use [place]'s universal password to access. If you do not have the password, please contact the hospital operator. 3. Locate the [facility] provider you are looking for under [facility] and page to a number that you can be directly reached. 4. If you still have difficulty reaching the provider, please page the [facility] (Director on Call) for the Hospitalists listed on amion for assistance.   10/16/2019, 6:15 PM

## 2019-10-16 NOTE — ED Provider Notes (Signed)
[facility]-EMERGENCY DEPT Provider Note   CSN: [id number] Arrival date & time: 10/16/19  1029     History Chief Complaint  Patient presents with  . PNA    Alyssa Barnett is a 44 y.o. female.  44 year old female with history of HIV who was diagnosed with pneumonia 2 weeks ago who presents with increasing shortness of breath.  Patient left AMA and since that time has been getting worse.  States she is cocaine 2 days ago.  Has been noncompliant with her medications.  Has had body aches and diffuse weakness.  Denies any recent vomiting or diarrhea.  No abdominal discomfort.  Was at [facility] yesterday but left before being seen.        Past Medical History:  Diagnosis Date  . HIV (human immunodeficiency virus infection) (HCC)     There are no problems to display for this patient.   History reviewed. No pertinent surgical history.   OB History   No obstetric history on file.     No family history on file.  Social History   Tobacco Use  . Smoking status: Current Every Day Smoker  . Smokeless tobacco: Never Used  Vaping Use  . Vaping Use: Never used  Substance Use Topics  . Alcohol use: Yes  . Drug use: Yes    Types: Marijuana, Cocaine    Comment: crack     Home Medications Prior to Admission medications   Not on File    Allergies    Patient has no known allergies.  Review of Systems   Review of Systems  All other systems reviewed and are negative.   Physical Exam Updated Vital Signs BP (!) 159/114 (BP Location: Left Arm)   Pulse 92   Temp 97.7 F (36.5 C) (Oral)   Resp (!) 31   LMP 09/30/2019   SpO2 94%   Physical Exam Vitals and nursing note reviewed.  Constitutional:      General: She is not in acute distress.    Appearance: Normal appearance. She is well-developed. She is not toxic-appearing.  HENT:     Head: Normocephalic and atraumatic.  Eyes:     General: Lids are normal.     Conjunctiva/sclera:  Conjunctivae normal.     Pupils: Pupils are equal, round, and reactive to light.  Neck:     Thyroid: No thyroid mass.     Trachea: No tracheal deviation.  Cardiovascular:     Rate and Rhythm: Normal rate and regular rhythm.     Heart sounds: Normal heart sounds. No murmur heard.  No gallop.   Pulmonary:     Effort: Pulmonary effort is normal. No respiratory distress.     Breath sounds: No stridor. Decreased breath sounds present. No wheezing, rhonchi or rales.  Abdominal:     General: Bowel sounds are normal. There is no distension.     Palpations: Abdomen is soft.     Tenderness: There is no abdominal tenderness. There is no rebound.  Musculoskeletal:        General: No tenderness. Normal range of motion.     Cervical back: Normal range of motion and neck supple.  Skin:    General: Skin is warm and dry.     Findings: No abrasion or rash.  Neurological:     Mental Status: She is alert and oriented to person, place, and time.     GCS: GCS eye subscore is 4. GCS verbal subscore is 5. GCS motor  subscore is 6.     Cranial Nerves: No cranial nerve deficit.     Sensory: No sensory deficit.  Psychiatric:        Speech: Speech normal.        Behavior: Behavior normal.     ED Results / Procedures / Treatments   Labs (all labs ordered are listed, but only abnormal results are displayed) Labs Reviewed  CULTURE, BLOOD (SINGLE)  SARS CORONAVIRUS 2 BY RT PCR (HOSPITAL ORDER, PERFORMED IN [facility] [facility])  CULTURE, BLOOD (ROUTINE X 2)  CULTURE, BLOOD (ROUTINE X 2)  LIPASE, BLOOD  COMPREHENSIVE METABOLIC PANEL  CBC  URINALYSIS, ROUTINE W REFLEX MICROSCOPIC  BRAIN NATRIURETIC PEPTIDE  LACTIC ACID, PLASMA  LACTIC ACID, PLASMA  D-DIMER, QUANTITATIVE (NOT AT [facility])  PROCALCITONIN  LACTATE DEHYDROGENASE  FERRITIN  TRIGLYCERIDES  FIBRINOGEN  C-REACTIVE PROTEIN  I-STAT BETA HCG BLOOD, ED ([facility], [facility], [facility] ONLY)  I-STAT BETA HCG BLOOD, ED ([facility], [facility], [facility] ONLY)     EKG None  Radiology DG Chest 2 View  Result Date: 10/16/2019 CLINICAL DATA:  Pneumonia, productive cough. EXAM: CHEST - 2 VIEW COMPARISON:  10/04/2019 FINDINGS: Stable top-normal heart size. Worsening bilateral lower lobe no filtrates with associated small bilateral pleural effusions. Possible overlying mild pulmonary interstitial edema. No pneumothorax. IMPRESSION: Worsening bilateral lower lobe infiltrates with associated small bilateral pleural effusions. Possible mild overlying pulmonary interstitial edema. Electronically Signed   By: Irish Lack M.D.   On: 10/16/2019 11:33    Procedures Procedures (including critical care time)  Medications Ordered in ED Medications  lactated ringers infusion (has no administration in time range)  cefTRIAXone (ROCEPHIN) 2 g in sodium chloride 0.9 % 100 mL IVPB (has no administration in time range)  azithromycin (ZITHROMAX) 500 mg in sodium chloride 0.9 % 250 mL IVPB (has no administration in time range)    ED Course  I have reviewed the triage vital signs and the nursing notes.  Pertinent labs & imaging results that were available during my care of the patient were reviewed by me and considered in my medical decision making (see chart for details).    MDM Rules/Calculators/A&P                          Patient's initial x-ray concerning for pneumonia.  Started on empiric antibiotics.  Added BNP which is significantly elevated and will give patient Lasix.  D-dimer elevated but likely from patient's pneumonia and CHF.  Covid test is negative at this time.  Will admit to the hospital  CRITICAL CARE Performed by: Toy Baker Total critical care time: 50 minutes Critical care time was exclusive of separately billable procedures and treating other patients. Critical care was necessary to treat or prevent imminent or life-threatening deterioration. Critical care was time spent personally by me on the following activities: development of  treatment plan with patient and/or surrogate as well as nursing, discussions with consultants, evaluation of patient's response to treatment, examination of patient, obtaining history from patient or surrogate, ordering and performing treatments and interventions, ordering and review of laboratory studies, ordering and review of radiographic studies, pulse oximetry and re-evaluation of patient's condition.  Final Clinical Impression(s) / ED Diagnoses Final diagnoses:  None    Rx / DC Orders ED Discharge Orders    None       Lorre Nick, MD 10/16/19 1516

## 2019-10-16 NOTE — ED Notes (Signed)
IV team was able to get an IV but could not get it to pull back for labs. Will call phlebotomy.

## 2019-10-16 NOTE — ED Notes (Addendum)
Attempted to give patient her night medications. Patient states she wants to wait because she is, "freaking out right now." Offered patient anxiety medication, which she accepted and her nicotine patch. Will attempt the rest of her medications and blood work later.

## 2019-10-17 ENCOUNTER — Inpatient Hospital Stay (HOSPITAL_COMMUNITY): Payer: Medicaid Other

## 2019-10-17 ENCOUNTER — Other Ambulatory Visit: Payer: Self-pay

## 2019-10-17 ENCOUNTER — Encounter (HOSPITAL_COMMUNITY): Payer: Self-pay | Admitting: Internal Medicine

## 2019-10-17 DIAGNOSIS — I509 Heart failure, unspecified: Secondary | ICD-10-CM

## 2019-10-17 LAB — CBC WITH DIFFERENTIAL/PLATELET
Abs Immature Granulocytes: 0.01 10*3/uL (ref 0.00–0.07)
Basophils Absolute: 0.1 10*3/uL (ref 0.0–0.1)
Basophils Relative: 1 %
Eosinophils Absolute: 0 10*3/uL (ref 0.0–0.5)
Eosinophils Relative: 1 %
HCT: 39.5 % (ref 36.0–46.0)
Hemoglobin: 12.6 g/dL (ref 12.0–15.0)
Immature Granulocytes: 0 %
Lymphocytes Relative: 36 %
Lymphs Abs: 1.6 10*3/uL (ref 0.7–4.0)
MCH: 26.8 pg (ref 26.0–34.0)
MCHC: 31.9 g/dL (ref 30.0–36.0)
MCV: 83.9 fL (ref 80.0–100.0)
Monocytes Absolute: 0.2 10*3/uL (ref 0.1–1.0)
Monocytes Relative: 4 %
Neutro Abs: 2.5 10*3/uL (ref 1.7–7.7)
Neutrophils Relative %: 58 %
Platelets: 152 10*3/uL (ref 150–400)
RBC: 4.71 MIL/uL (ref 3.87–5.11)
RDW: 13.9 % (ref 11.5–15.5)
WBC: 4.3 10*3/uL (ref 4.0–10.5)
nRBC: 0 % (ref 0.0–0.2)

## 2019-10-17 LAB — BASIC METABOLIC PANEL
Anion gap: 8 (ref 5–15)
BUN: 19 mg/dL (ref 6–20)
CO2: 20 mmol/L — ABNORMAL LOW (ref 22–32)
Calcium: 7.5 mg/dL — ABNORMAL LOW (ref 8.9–10.3)
Chloride: 103 mmol/L (ref 98–111)
Creatinine, Ser: 1.17 mg/dL — ABNORMAL HIGH (ref 0.44–1.00)
GFR calc Af Amer: >60 mL/min (ref >60)
GFR calc non Af Amer: 57 mL/min — ABNORMAL LOW (ref >60)
Glucose, Bld: 112 mg/dL — ABNORMAL HIGH (ref 70–99)
Potassium: 3.7 mmol/L (ref 3.5–5.1)
Sodium: 131 mmol/L — ABNORMAL LOW (ref 135–145)

## 2019-10-17 LAB — T-HELPER CELLS (CD4) COUNT (NOT AT ARMC)
CD4 % Helper T Cell: 23 % — ABNORMAL LOW (ref 33–65)
CD4 T Cell Abs: 476 /uL (ref 400–1790)

## 2019-10-17 LAB — LIPID PANEL
Cholesterol: 99 mg/dL (ref 0–200)
HDL: 20 mg/dL — ABNORMAL LOW (ref >40)
LDL Cholesterol: 59 mg/dL (ref 0–99)
Total CHOL/HDL Ratio: 5 RATIO
Triglycerides: 100 mg/dL (ref <150)
VLDL: 20 mg/dL (ref 0–40)

## 2019-10-17 LAB — TROPONIN I (HIGH SENSITIVITY): Troponin I (High Sensitivity): 26 ng/L — ABNORMAL HIGH (ref <18)

## 2019-10-17 LAB — ECHOCARDIOGRAM COMPLETE
Area-P 1/2: 4.6 cm2
S' Lateral: 4.5 cm

## 2019-10-17 LAB — RPR: RPR Ser Ql: NONREACTIVE

## 2019-10-17 MED ORDER — DARUN-COBIC-EMTRICIT-TENOFAF 800-150-200-10 MG PO TABS
1.0000 | ORAL_TABLET | Freq: Every day | ORAL | Status: DC
  Administered 2019-10-17 – 2019-10-22 (×6): 1 via ORAL
  Filled 2019-10-17 (×6): qty 1

## 2019-10-17 MED ORDER — ENOXAPARIN SODIUM 40 MG/0.4ML ~~LOC~~ SOLN
40.0000 mg | SUBCUTANEOUS | Status: DC
  Administered 2019-10-17 – 2019-10-18 (×2): 40 mg via SUBCUTANEOUS
  Filled 2019-10-17 (×5): qty 0.4

## 2019-10-17 NOTE — Progress Notes (Signed)
PROGRESS NOTE  Alyssa Barnett  RAQ:[id number] DOB: 07/06/1975 DOA: 10/16/2019 PCP: Patient, No Pcp Per   Brief Narrative: Alyssa Barnett is a 44 y.o. female with medical history significant of untreated HIV presenting with SOB.  She was seen in the ER on 8/31 with chest discomfort.  CXR with ?atypical infection, decreased O2 sat and started on [place] O2.  Elevated D-dimer, unable to tolerate CT.  Left AMA.  She returns with c/o pleuritic chest pain.  At the time of my evaluation, she was lying curled up and refused to answer questions other than to say "pain" repeatedly and intermittently say that her chest hurt.  She periodically screams out for the nurse due to pain. UDS +cocaine, THC. WBC wnl, afebrile. Lasix and antibiotics were started with ID consultation and the patient was admitted with CTA chest pending. This revealed cardiomegaly but no PE and no significant pulmonary infiltrate/pneumonia. Antibiotics are stopped and echocardiogram is pending.  Assessment & Plan: Principal Problem:   Acquired immunodeficiency syndrome (AIDS) with bacterial pneumonia (HCC) Active Problems:   New onset of congestive heart failure (HCC)   Polysubstance abuse (HCC)   Tobacco dependence  HIV disease: Untreated.  - Restart ART per ID - Viral load pending. CD4 count is sufficient. Will need continued follow up.  - Quantiferon gold pending  Acute CHF: Suspected to be cause of opacities on radiologic evaluation with cardiomegaly, elevated BNP, interstitial edema.  - Continue IV lasix - Monitor I/O, daily weights. No UOP charted yet while in ED. Weight taken this PM, 118lbs.  - Echocardiogram is pending.  - Avoiding BB w/+cocaine.   Pneumonia ruled out: No consolidation on CXR, PCT and CRP and WBC negative, afebrile.  - DC abx  Troponin elevation: In setting of suspected CHF and cocaine use. Mildly elevated without overtly ischemic ECG changes.  - Avoid cocaine - Cardiology evaluation planned,  either as inpatient or following discharge.  - Continue toradol for pain.   Polysubstance abuse - Continue COWS protocol and clonidine.  - Narcotics not currently indicated for treatment of pain as above.  Tobacco dependence - Continue cessation counseling - Nicotine patch if needed.   DVT prophylaxis: Lovenox, deescalate to 40mg  daily Code Status: Full Family Communication: None at bedside Disposition Plan:  Status is: Inpatient  Remains inpatient appropriate because:Ongoing diagnostic testing needed not appropriate for outpatient work up  Dispo: The patient is from: [facility]              Anticipated d/c is to: [facility]              Anticipated d/c date is: 1 day              Patient currently is not medically stable to d/c.  Consultants:   ID  Procedures:   Echocardiogram 10/17/2019  Antimicrobials:  Ceftriaxone, azithromycin  ART   Subjective: Pain all throughout chest remains constant, severe, not associated with breathing or exertion. No cough this AM. Feels marginally better than yesterday.   Objective: Vitals:   10/17/19 0323 10/17/19 0827 10/17/19 1359 10/17/19 1359  BP: (!) 151/118 (!) 137/92  (!) 124/95  Pulse: 80 73  88  Resp: 18 20  16   Temp:    98 F (36.7 C)  TempSrc:      SpO2: 91% 95%  94%  Weight:   53.6 kg   Height:   5\' 4"  (1.626 m)     Intake/Output Summary (Last 24 hours) at 10/17/2019 1458 Last  data filed at 10/16/2019 1930 Gross per 24 hour  Intake 252.12 ml  Output --  Net 252.12 ml   Filed Weights   10/17/19 1359  Weight: 53.6 kg    Gen: 44 y.o. female in no distress Pulm: Non-labored breathing. Clear to auscultation bilaterally.  CV: Regular rate and rhythm. No murmur, rub, or gallop. No JVD, no pitting pedal edema. GI: Abdomen soft, non-tender, non-distended, with normoactive bowel sounds. No organomegaly or masses felt. Ext: Warm, no deformities Skin: No rashes, lesions or ulcers Neuro: Alert and oriented. No focal  neurological deficits. Psych: Judgement and insight appear normal. Mood & affect appropriate.   Data Reviewed: I have personally reviewed following labs and imaging studies  CBC: Recent Labs  Lab 10/16/19 1332 10/17/19 0630  WBC 5.3 4.3  NEUTROABS  --  2.5  HGB 12.6 12.6  HCT 39.4 39.5  MCV 81.6 83.9  PLT 173 152   Basic Metabolic Panel: Recent Labs  Lab 10/16/19 1332 10/17/19 0630  NA 128* 131*  K 5.7* 3.7  CL 98 103  CO2 19* 20*  GLUCOSE 134* 112*  BUN 15 19  CREATININE 1.08* 1.17*  CALCIUM 8.5* 7.5*   GFR: Estimated Creatinine Clearance: 53 mL/min (A) (by C-G formula based on SCr of 1.17 mg/dL (H)). Liver Function Tests: Recent Labs  Lab 10/16/19 1332  AST 62*  ALT 22  ALKPHOS 54  BILITOT 0.8  PROT 6.8  ALBUMIN 2.8*   Recent Labs  Lab 10/16/19 1332  LIPASE 26   No results for input(s): AMMONIA in the last 168 hours. Coagulation Profile: No results for input(s): INR, PROTIME in the last 168 hours. Cardiac Enzymes: No results for input(s): CKTOTAL, CKMB, CKMBINDEX, TROPONINI in the last 168 hours. BNP (last 3 results) No results for input(s): PROBNP in the last 8760 hours. HbA1C: No results for input(s): HGBA1C in the last 72 hours. CBG: No results for input(s): GLUCAP in the last 168 hours. Lipid Profile: Recent Labs    10/16/19 1358 10/17/19 0630  CHOL  --  99  HDL  --  20*  LDLCALC  --  59  TRIG 103 100  CHOLHDL  --  5.0   Thyroid Function Tests: No results for input(s): TSH, T4TOTAL, FREET4, T3FREE, THYROIDAB in the last 72 hours. Anemia Panel: Recent Labs    10/16/19 1358  FERRITIN 25   Urine analysis:    Component Value Date/Time   COLORURINE STRAW (A) 10/16/2019 1728   APPEARANCEUR CLEAR 10/16/2019 1728   LABSPEC 1.009 10/16/2019 1728   PHURINE 6.0 10/16/2019 1728   GLUCOSEU NEGATIVE 10/16/2019 1728   HGBUR SMALL (A) 10/16/2019 1728   BILIRUBINUR NEGATIVE 10/16/2019 1728   KETONESUR NEGATIVE 10/16/2019 1728   PROTEINUR  100 (A) 10/16/2019 1728   NITRITE NEGATIVE 10/16/2019 1728   LEUKOCYTESUR NEGATIVE 10/16/2019 1728   Recent Results (from the past 240 hour(s))  SARS Coronavirus 2 by RT PCR (hospital order, performed in [facility] hospital lab) Nasopharyngeal Nasopharyngeal Swab     Status: None   Collection Time: 10/16/19  1:32 PM   Specimen: Nasopharyngeal Swab  Result Value Ref Range Status   SARS Coronavirus 2 NEGATIVE NEGATIVE Final    Comment: (NOTE) SARS-CoV-2 target nucleic acids are NOT DETECTED.  The SARS-CoV-2 RNA is generally detectable in upper and lower respiratory specimens during the acute phase of infection. The lowest concentration of SARS-CoV-2 viral copies this assay can detect is 250 copies / mL. A negative result does not preclude SARS-CoV-2  infection and should not be used as the sole basis for treatment or other patient management decisions.  A negative result may occur with improper specimen collection / handling, submission of specimen other than nasopharyngeal swab, presence of viral mutation(s) within the areas targeted by this assay, and inadequate number of viral copies (<250 copies / mL). A negative result must be combined with clinical observations, patient history, and epidemiological information.  Fact Sheet for Patients:   BoilerBrush.com.cy  Fact Sheet for Healthcare Providers: https://pope.com/  This test is not yet approved or  cleared by the [place] FDA and has been authorized for detection and/or diagnosis of SARS-CoV-2 by FDA under an Emergency Use Authorization (EUA).  This EUA will remain in effect (meaning this test can be used) for the duration of the COVID-19 declaration under Section 564(b)(1) of the Act, 21 U.S.C. section 360bbb-3(b)(1), unless the authorization is terminated or revoked sooner.  Performed at [facility], [address], [place], [place] [address]   Blood  Culture (routine x 2)     Status: None (Preliminary result)   Collection Time: 10/16/19  1:58 PM   Specimen: BLOOD  Result Value Ref Range Status   Specimen Description   Final    BLOOD RIGHT UPPER ARM Performed at [facility], [address], [place], [place] [address]    Special Requests   Final    BOTTLES DRAWN AEROBIC AND ANAEROBIC Blood Culture results may not be optimal due to an excessive volume of blood received in culture bottles Performed at [facility], [address] [place], [place] [address]    Culture   Final    NO GROWTH < 24 HOURS Performed at [facility], [address] [place], [place] [address]    Report Status PENDING  Incomplete  Blood Culture (routine x 2)     Status: None (Preliminary result)   Collection Time: 10/16/19  1:58 PM   Specimen: BLOOD  Result Value Ref Range Status   Specimen Description   Final    BLOOD RIGHT ANTECUBITAL Performed at [facility], [address], [place], [place] [address]    Special Requests   Final    BOTTLES DRAWN AEROBIC AND ANAEROBIC Blood Culture adequate volume Performed at [facility], [address] [place], [place] [address]    Culture   Final    NO GROWTH < 24 HOURS Performed at [facility], [address] [place], [place] [address]    Report Status PENDING  Incomplete      Radiology Studies: DG Chest 2 View  Result Date: 10/16/2019 CLINICAL DATA:  Pneumonia, productive cough. EXAM: CHEST - 2 VIEW COMPARISON:  10/04/2019 FINDINGS: Stable top-normal heart size. Worsening bilateral lower lobe no filtrates with associated small bilateral pleural effusions. Possible overlying mild pulmonary interstitial edema. No pneumothorax. IMPRESSION: Worsening bilateral lower lobe infiltrates with associated small bilateral pleural effusions. Possible mild overlying pulmonary interstitial edema. Electronically Signed   By: Irish Lack M.D.   On: 10/16/2019 11:33   CT ANGIO CHEST PE W OR WO CONTRAST  Result Date: 10/17/2019 CLINICAL DATA:  High probability for pulmonary embolism. History of substance abuse. EXAM: CT ANGIOGRAPHY CHEST WITH CONTRAST TECHNIQUE: Multidetector CT imaging of the chest was performed using the standard protocol during bolus administration of intravenous contrast. Multiplanar CT image reconstructions and MIPs were obtained to evaluate the vascular anatomy. CONTRAST:  82mL OMNIPAQUE IOHEXOL 350 MG/ML  SOLN COMPARISON:  None. FINDINGS: Cardiovascular: Enlarged appearance of the heart. No significant pericardial effusion. No pulmonary artery filling defect Mediastinum/Nodes: Negative for adenopathy or mass. Lungs/Pleura: Moderate layering pleural effusions. Streaky opacity in the lower lungs with volume loss more consistent with atelectasis than consolidation. Minimal and nonspecific ground-glass opacity in the subpleural left upper lobe. Mild generalized interlobular septal thickening best seen on coronal reformats Upper Abdomen: Intrahepatic venous reflux. Small volume ascites seen about the liver. Musculoskeletal: No acute or aggressive finding. Degenerative facet and endplate changes seen at T11-12. Review of the MIP images confirms the above findings. IMPRESSION: 1. Cardiomegaly with mild interstitial edema and moderate pleural effusions. Small volume ascites is also seen in the upper abdomen. 2. Pulmonary opacities with morphology suggesting atelectasis. 3. Negative for pulmonary embolism. Electronically Signed   By: Marnee Spring M.D.   On: 10/17/2019 11:22    Scheduled Meds: . aspirin EC  81 mg Oral Daily  . cloNIDine  0.1 mg Oral QID   Followed by  . [START ON 10/19/2019] cloNIDine  0.1 mg Oral BH-qamhs   Followed by  . [START ON 10/21/2019] cloNIDine  0.1 mg Oral QAC breakfast  . Darunavir-Cobicisctat-Emtricitabine-Tenofovir Alafenamide  1 tablet Oral Q breakfast  . enoxaparin (LOVENOX)  injection  40 mg Subcutaneous Q24H  . furosemide  40 mg Intravenous Q12H  . nicotine  14 mg Transdermal Daily  . sodium chloride flush  3 mL Intravenous Q12H   Continuous Infusions: . sodium chloride       LOS: 1 day   Time spent: 25 minutes.  Tyrone Nine, MD [facility] [email or web address] 10/17/2019, 2:58 PM

## 2019-10-17 NOTE — Progress Notes (Signed)
  Echocardiogram 2D Echocardiogram has been performed.  Celene Skeen 10/17/2019, 11:54 AM

## 2019-10-17 NOTE — Progress Notes (Signed)
PT Cancellation Note  Patient Details Name: Alyssa Barnett MRN: [id number] DOB: January 26, 1976   Cancelled Treatment:    Reason Eval/Treat Not Completed: Other (comment), transferred to 4 E. Will check back tomorrow.   Rada Hay 10/17/2019, 4:34 PM  Blanchard Kelch PT Acute Rehabilitation Services Pager [phone] Office [phone]

## 2019-10-17 NOTE — Progress Notes (Signed)
.   Transition of Care [facility]) - Emergency Department Mini Assessment   Patient Details  Name: Alyssa Barnett MRN: [id number] Date of Birth: 1975-10-03  Transition of Care [facility]) CM/SW Contact:    Mikel Pyon C Tarpley-Carter, LCSWA Phone Number: 10/17/2019, 2:10 PM   Clinical Narrative: TOC CSW went to consult pt about SNF placement.  Pt stated she did not want to go to a SNF.  Pt stated she wanted her property secured for when she returned to [facility] to stay.  Pt stated that she could afford to stay there and was not homeless.    ED Mini Assessment: What brought you to the Emergency Department? : Pneumonia  Barriers to Discharge: Barriers Resolved  Barrier interventions: Pt did not need shelter, she can afford hotel stay.  Pt want her items left at the hotel secured.  Pr/Staff at [facility] put pts property in [employer].  Means of departure: Not know  Interventions which prevented an admission or readmission: Follow-up medical appointment, Education about diagnosis, Other (must enter comment) (Compliant with medications)    Patient Contact and Communications Key Contact 1: [facility] [employer])   Spoke with: Pr/Hotel Staff at [facility] Date: 10/17/19,     Contact Phone Number: [phone] Call outcome: Pr/Staff member at [facility] stated she would put pts property in [employer] until she returned.  Patient states their goals for this hospitalization and ongoing recovery are:: To get better, so she can return to [facility] to get her property.      Admission diagnosis:  Acquired immunodeficiency syndrome (AIDS) with bacterial pneumonia (HCC) [B20, J17] Community acquired pneumonia, unspecified laterality [J18.9] Patient Active Problem List   Diagnosis Date Noted  . Acquired immunodeficiency syndrome (AIDS) with bacterial pneumonia (HCC) 10/16/2019  . New onset of congestive heart failure (HCC) 10/16/2019  . Polysubstance abuse (HCC) 10/16/2019  .  Tobacco dependence 10/16/2019   PCP:  Patient, No Pcp Per Pharmacy:   [id number] - [place], [place] - [address] AT [facility] [address] [place] [address] Phone: [phone] Fax: [phone]

## 2019-10-17 NOTE — Progress Notes (Signed)
INFECTIOUS DISEASE PROGRESS NOTE  ID: Alyssa Barnett is a 44 y.o. female with  Principal Problem:   Acquired immunodeficiency syndrome (AIDS) with bacterial pneumonia (HCC) Active Problems:   New onset of congestive heart failure (HCC)   Polysubstance abuse (HCC)   Tobacco dependence  Subjective: Continued chest pain.  Not dyspneic. No cough during visit.   Abtx:  Anti-infectives (From admission, onward)   Start     Dose/Rate Route Frequency Ordered Stop   10/17/19 1000  Darunavir-Cobicisctat-Emtricitabine-Tenofovir Alafenamide (SYMTUZA) 800-150-200-10 MG TABS 1 tablet        1 tablet Oral Daily with breakfast 10/17/19 0909     10/16/19 1330  cefTRIAXone (ROCEPHIN) 2 g in sodium chloride 0.9 % 100 mL IVPB        2 g 200 mL/hr over 30 Minutes Intravenous Every 24 hours 10/16/19 1325     10/16/19 1330  azithromycin (ZITHROMAX) 500 mg in sodium chloride 0.9 % 250 mL IVPB        500 mg 250 mL/hr over 60 Minutes Intravenous Every 24 hours 10/16/19 1325        Medications:  Scheduled: . aspirin EC  81 mg Oral Daily  . cloNIDine  0.1 mg Oral QID   Followed by  . [START ON 10/19/2019] cloNIDine  0.1 mg Oral BH-qamhs   Followed by  . [START ON 10/21/2019] cloNIDine  0.1 mg Oral QAC breakfast  . Darunavir-Cobicisctat-Emtricitabine-Tenofovir Alafenamide  1 tablet Oral Q breakfast  . enoxaparin (LOVENOX) injection  40 mg Subcutaneous Q24H  . furosemide  40 mg Intravenous Q12H  . nicotine  14 mg Transdermal Daily  . sodium chloride flush  3 mL Intravenous Q12H    Objective: Vital signs in last 24 hours: Temp:  [97.7 F (36.5 C)-98 F (36.7 C)] 98 F (36.7 C) (09/15 1359) Pulse Rate:  [72-90] 88 (09/15 1359) Resp:  [16-21] 16 (09/15 1359) BP: (124-163)/(92-126) 124/95 (09/15 1359) SpO2:  [90 %-95 %] 94 % (09/15 1359) Weight:  [53.6 kg] 53.6 kg (09/15 1359)   General appearance: alert, cooperative and no distress Resp: clear to auscultation bilaterally Cardio: regular  rate and rhythm GI: normal findings: bowel sounds normal and soft, non-tender Extremities: edema none  Lab Results Recent Labs    10/16/19 1332 10/17/19 0630  WBC 5.3 4.3  HGB 12.6 12.6  HCT 39.4 39.5  NA 128* 131*  K 5.7* 3.7  CL 98 103  CO2 19* 20*  BUN 15 19  CREATININE 1.08* 1.17*   Liver Panel Recent Labs    10/16/19 1332  PROT 6.8  ALBUMIN 2.8*  AST 62*  ALT 22  ALKPHOS 54  BILITOT 0.8   Sedimentation Rate No results for input(s): ESRSEDRATE in the last 72 hours. C-Reactive Protein Recent Labs    10/16/19 1358  CRP <0.5    Microbiology: Recent Results (from the past 240 hour(s))  SARS Coronavirus 2 by RT PCR (hospital order, performed in [facility] hospital lab) Nasopharyngeal Nasopharyngeal Swab     Status: None   Collection Time: 10/16/19  1:32 PM   Specimen: Nasopharyngeal Swab  Result Value Ref Range Status   SARS Coronavirus 2 NEGATIVE NEGATIVE Final    Comment: (NOTE) SARS-CoV-2 target nucleic acids are NOT DETECTED.  The SARS-CoV-2 RNA is generally detectable in upper and lower respiratory specimens during the acute phase of infection. The lowest concentration of SARS-CoV-2 viral copies this assay can detect is 250 copies / mL. A negative result does not preclude  SARS-CoV-2 infection and should not be used as the sole basis for treatment or other patient management decisions.  A negative result may occur with improper specimen collection / handling, submission of specimen other than nasopharyngeal swab, presence of viral mutation(s) within the areas targeted by this assay, and inadequate number of viral copies (<250 copies / mL). A negative result must be combined with clinical observations, patient history, and epidemiological information.  Fact Sheet for Patients:   BoilerBrush.com.cy  Fact Sheet for Healthcare Providers: https://pope.com/  This test is not yet approved or  cleared by the  [place] FDA and has been authorized for detection and/or diagnosis of SARS-CoV-2 by FDA under an Emergency Use Authorization (EUA).  This EUA will remain in effect (meaning this test can be used) for the duration of the COVID-19 declaration under Section 564(b)(1) of the Act, 21 U.S.C. section 360bbb-3(b)(1), unless the authorization is terminated or revoked sooner.  Performed at [facility], [address], [place], [place] [address]   Blood Culture (routine x 2)     Status: None (Preliminary result)   Collection Time: 10/16/19  1:58 PM   Specimen: BLOOD  Result Value Ref Range Status   Specimen Description   Final    BLOOD RIGHT UPPER ARM Performed at [facility], [address] [place], [place] [address]    Special Requests   Final    BOTTLES DRAWN AEROBIC AND ANAEROBIC Blood Culture results may not be optimal due to an excessive volume of blood received in culture bottles Performed at [facility], [address], [place], [place] [address]    Culture   Final    NO GROWTH < 24 HOURS Performed at [facility], [address] [place], [place] [address]    Report Status PENDING  Incomplete  Blood Culture (routine x 2)     Status: None (Preliminary result)   Collection Time: 10/16/19  1:58 PM   Specimen: BLOOD  Result Value Ref Range Status   Specimen Description   Final    BLOOD RIGHT ANTECUBITAL Performed at [facility], [address], [place], [place] [address]    Special Requests   Final    BOTTLES DRAWN AEROBIC AND ANAEROBIC Blood Culture adequate volume Performed at [facility], [address] [place], [place] [address]    Culture   Final    NO GROWTH < 24 HOURS Performed at [facility], [address], [place], [place] [address]    Report Status PENDING  Incomplete    Studies/Results: DG Chest 2 View  Result Date: 10/16/2019 CLINICAL DATA:   Pneumonia, productive cough. EXAM: CHEST - 2 VIEW COMPARISON:  10/04/2019 FINDINGS: Stable top-normal heart size. Worsening bilateral lower lobe no filtrates with associated small bilateral pleural effusions. Possible overlying mild pulmonary interstitial edema. No pneumothorax. IMPRESSION: Worsening bilateral lower lobe infiltrates with associated small bilateral pleural effusions. Possible mild overlying pulmonary interstitial edema. Electronically Signed   By: Irish Lack M.D.   On: 10/16/2019 11:33   CT ANGIO CHEST PE W OR WO CONTRAST  Result Date: 10/17/2019 CLINICAL DATA:  High probability for pulmonary embolism. History of substance abuse. EXAM: CT ANGIOGRAPHY CHEST WITH CONTRAST TECHNIQUE: Multidetector CT imaging of the chest was performed using the standard protocol during bolus administration of intravenous contrast. Multiplanar CT image reconstructions and MIPs were obtained to evaluate the vascular anatomy. CONTRAST:  4mL OMNIPAQUE IOHEXOL 350 MG/ML SOLN COMPARISON:  None. FINDINGS: Cardiovascular: Enlarged appearance of the heart. No significant pericardial effusion. No pulmonary artery filling defect Mediastinum/Nodes: Negative for adenopathy or mass. Lungs/Pleura: Moderate layering pleural effusions. Streaky opacity in the lower lungs with volume loss more consistent with atelectasis than consolidation. Minimal and nonspecific ground-glass opacity in the subpleural left upper lobe. Mild generalized interlobular septal thickening best seen on coronal reformats Upper Abdomen: Intrahepatic venous reflux. Small volume ascites seen about the liver. Musculoskeletal: No acute or aggressive finding. Degenerative facet and endplate changes seen at T11-12. Review of the MIP images confirms the above findings. IMPRESSION: 1. Cardiomegaly with mild interstitial edema and moderate pleural effusions. Small volume ascites is also seen in the upper abdomen. 2. Pulmonary opacities with morphology suggesting  atelectasis. 3. Negative for pulmonary embolism. Electronically Signed   By: Marnee Spring M.D.   On: 10/17/2019 11:22     Assessment/Plan: HIV+ Chest pain HTN Hyponatremia Cocaine abuse Homeless  Total days of antibiotics: 1 ceftriaxone azithro  Will stop her anbx for pneumonia No evidence on CT scan.  Await w/u of her cardiomegally, TTE.  HIV RNA pending         Johny Sax MD, FACP Infectious Diseases (pager) [phone] [email or web address] 10/17/2019, 2:29 PM  LOS: 1 day

## 2019-10-17 NOTE — ED Notes (Signed)
Pt refuses vital signs and lab work. Pt continues to throw everything on the floor.

## 2019-10-17 NOTE — Plan of Care (Signed)
Plan of care reviewed.  Pt unwilling to discuss at this time.

## 2019-10-17 NOTE — Progress Notes (Signed)
TOC CSW consult request has been received. CSW attempting to follow up at present time.   CSW will continue to follow for dc needs.  Evalisse Prajapati Tarpley-Carter, MSW, LCSW-A                  [facility] Transitions of CareClinical Social Worker [email or web address] [phone] 

## 2019-10-18 DIAGNOSIS — B20 Human immunodeficiency virus [HIV] disease: Principal | ICD-10-CM

## 2019-10-18 DIAGNOSIS — J17 Pneumonia in diseases classified elsewhere: Secondary | ICD-10-CM

## 2019-10-18 DIAGNOSIS — F17200 Nicotine dependence, unspecified, uncomplicated: Secondary | ICD-10-CM

## 2019-10-18 DIAGNOSIS — F1910 Other psychoactive substance abuse, uncomplicated: Secondary | ICD-10-CM

## 2019-10-18 DIAGNOSIS — I5041 Acute combined systolic (congestive) and diastolic (congestive) heart failure: Secondary | ICD-10-CM

## 2019-10-18 LAB — BASIC METABOLIC PANEL
Anion gap: 12 (ref 5–15)
BUN: 19 mg/dL (ref 6–20)
CO2: 24 mmol/L (ref 22–32)
Calcium: 8 mg/dL — ABNORMAL LOW (ref 8.9–10.3)
Chloride: 95 mmol/L — ABNORMAL LOW (ref 98–111)
Creatinine, Ser: 1.55 mg/dL — ABNORMAL HIGH (ref 0.44–1.00)
GFR calc Af Amer: 47 mL/min — ABNORMAL LOW (ref >60)
GFR calc non Af Amer: 41 mL/min — ABNORMAL LOW (ref >60)
Glucose, Bld: 130 mg/dL — ABNORMAL HIGH (ref 70–99)
Potassium: 3.6 mmol/L (ref 3.5–5.1)
Sodium: 131 mmol/L — ABNORMAL LOW (ref 135–145)

## 2019-10-18 LAB — HEPATITIS PANEL, ACUTE
HCV Ab: NONREACTIVE
Hep A IgM: NONREACTIVE
Hep B C IgM: NONREACTIVE

## 2019-10-18 LAB — HEPATITIS B SURFACE AG, CONFIRM: HBsAG Confirmation: POSITIVE — AB

## 2019-10-18 LAB — LEGIONELLA PNEUMOPHILA SEROGP 1 UR AG: L. pneumophila Serogp 1 Ur Ag: NEGATIVE

## 2019-10-18 LAB — QUANTIFERON-TB GOLD PLUS: QuantiFERON-TB Gold Plus: NEGATIVE

## 2019-10-18 LAB — QUANTIFERON-TB GOLD PLUS (RQFGPL)
QuantiFERON Mitogen Value: >10 IU/mL
QuantiFERON Nil Value: 0.13 IU/mL
QuantiFERON TB1 Ag Value: 0.11 IU/mL
QuantiFERON TB2 Ag Value: 0.15 IU/mL

## 2019-10-18 LAB — RPR: RPR Ser Ql: NONREACTIVE

## 2019-10-18 MED ORDER — TRAMADOL HCL 50 MG PO TABS
50.0000 mg | ORAL_TABLET | Freq: Four times a day (QID) | ORAL | Status: DC | PRN
  Administered 2019-10-18 – 2019-10-21 (×10): 50 mg via ORAL
  Filled 2019-10-18 (×10): qty 1

## 2019-10-18 MED ORDER — SYMTUZA 800-150-200-10 MG PO TABS: 1.0000 | ORAL_TABLET | Freq: Every day | ORAL | 0 refills | Status: DC

## 2019-10-18 MED FILL — SYMTUZA 800-150-200-10 MG T: 800-150-200 | 30 days supply | Qty: 30 | Fill #0

## 2019-10-18 NOTE — Progress Notes (Signed)
INFECTIOUS DISEASE PROGRESS NOTE  ID: Alyssa Barnett is a 44 y.o. female with  Principal Problem:   Acquired immunodeficiency syndrome (AIDS) with bacterial pneumonia (HCC) Active Problems:   New onset of congestive heart failure (HCC)   Polysubstance abuse (HCC)   Tobacco dependence  Subjective: Continued pain  Abtx:  Anti-infectives (From admission, onward)   Start     Dose/Rate Route Frequency Ordered Stop   10/18/19 0000  Darunavir-Cobicisctat-Emtricitabine-Tenofovir Alafenamide (SYMTUZA) 800-150-200-10 MG TABS       Note to Pharmacy: 1 time fill please. PT assistance: [id number] Group: [id number] ID: [id number]   1 tablet Oral Daily with breakfast 10/18/19 0839     10/17/19 1000  Darunavir-Cobicisctat-Emtricitabine-Tenofovir Alafenamide (SYMTUZA) 800-150-200-10 MG TABS 1 tablet        1 tablet Oral Daily with breakfast 10/17/19 0909     10/16/19 1330  cefTRIAXone (ROCEPHIN) 2 g in sodium chloride 0.9 % 100 mL IVPB  Status:  Discontinued        2 g 200 mL/hr over 30 Minutes Intravenous Every 24 hours 10/16/19 1325 10/17/19 1430   10/16/19 1330  azithromycin (ZITHROMAX) 500 mg in sodium chloride 0.9 % 250 mL IVPB  Status:  Discontinued        500 mg 250 mL/hr over 60 Minutes Intravenous Every 24 hours 10/16/19 1325 10/17/19 1430      Medications:  Scheduled: . aspirin EC  81 mg Oral Daily  . cloNIDine  0.1 mg Oral QID   Followed by  . [START ON 10/19/2019] cloNIDine  0.1 mg Oral BH-qamhs   Followed by  . [START ON 10/21/2019] cloNIDine  0.1 mg Oral QAC breakfast  . Darunavir-Cobicisctat-Emtricitabine-Tenofovir Alafenamide  1 tablet Oral Q breakfast  . enoxaparin (LOVENOX) injection  40 mg Subcutaneous Q24H  . nicotine  14 mg Transdermal Daily  . sodium chloride flush  3 mL Intravenous Q12H    Objective: Vital signs in last 24 hours: Temp:  [97.7 F (36.5 C)-98.4 F (36.9 C)] 97.7 F (36.5 C) (09/16 1158) Pulse Rate:  [72-90] 88 (09/16 1158) Resp:  [18-22] 20  (09/16 1158) BP: (114-123)/(73-94) 116/94 (09/16 1158) SpO2:  [95 %-96 %] 96 % (09/16 1158) Weight:  [56.7 kg] 56.7 kg (09/16 0600)   General appearance: alert and no distress Resp: clear to auscultation bilaterally Cardio: regular rate and rhythm and systolic murmur: early systolic 3/6, blowing at 2nd left intercostal space, at 2nd right intercostal space GI: normal findings: bowel sounds normal and soft, non-tender  Lab Results Recent Labs    10/16/19 1332 10/16/19 1332 10/17/19 0630 10/18/19 0453  WBC 5.3  --  4.3  --   HGB 12.6  --  12.6  --   HCT 39.4  --  39.5  --   NA 128*   < > 131* 131*  K 5.7*   < > 3.7 3.6  CL 98   < > 103 95*  CO2 19*   < > 20* 24  BUN 15   < > 19 19  CREATININE 1.08*   < > 1.17* 1.55*   < > = values in this interval not displayed.   Liver Panel Recent Labs    10/16/19 1332  PROT 6.8  ALBUMIN 2.8*  AST 62*  ALT 22  ALKPHOS 54  BILITOT 0.8   Sedimentation Rate No results for input(s): ESRSEDRATE in the last 72 hours. C-Reactive Protein Recent Labs    10/16/19 1358  CRP <0.5    Microbiology: Recent  Results (from the past 240 hour(s))  SARS Coronavirus 2 by RT PCR (hospital order, performed in [facility] hospital lab) Nasopharyngeal Nasopharyngeal Swab     Status: None   Collection Time: 10/16/19  1:32 PM   Specimen: Nasopharyngeal Swab  Result Value Ref Range Status   SARS Coronavirus 2 NEGATIVE NEGATIVE Final    Comment: (NOTE) SARS-CoV-2 target nucleic acids are NOT DETECTED.  The SARS-CoV-2 RNA is generally detectable in upper and lower respiratory specimens during the acute phase of infection. The lowest concentration of SARS-CoV-2 viral copies this assay can detect is 250 copies / mL. A negative result does not preclude SARS-CoV-2 infection and should not be used as the sole basis for treatment or other patient management decisions.  A negative result may occur with improper specimen collection / handling, submission of  specimen other than nasopharyngeal swab, presence of viral mutation(s) within the areas targeted by this assay, and inadequate number of viral copies (<250 copies / mL). A negative result must be combined with clinical observations, patient history, and epidemiological information.  Fact Sheet for Patients:   BoilerBrush.com.cy  Fact Sheet for Healthcare Providers: https://pope.com/  This test is not yet approved or  cleared by the [place] FDA and has been authorized for detection and/or diagnosis of SARS-CoV-2 by FDA under an Emergency Use Authorization (EUA).  This EUA will remain in effect (meaning this test can be used) for the duration of the COVID-19 declaration under Section 564(b)(1) of the Act, 21 U.S.C. section 360bbb-3(b)(1), unless the authorization is terminated or revoked sooner.  Performed at [facility], [address], [place], [place] [address]   Blood Culture (routine x 2)     Status: None (Preliminary result)   Collection Time: 10/16/19  1:58 PM   Specimen: BLOOD  Result Value Ref Range Status   Specimen Description   Final    BLOOD RIGHT UPPER ARM Performed at [facility], [address], [place], [place] [address]    Special Requests   Final    BOTTLES DRAWN AEROBIC AND ANAEROBIC Blood Culture results may not be optimal due to an excessive volume of blood received in culture bottles Performed at [facility], [address] [place], [place] [address]    Culture   Final    NO GROWTH 2 DAYS Performed at [facility], [address], [place], [place] [address]    Report Status PENDING  Incomplete  Blood Culture (routine x 2)     Status: None (Preliminary result)   Collection Time: 10/16/19  1:58 PM   Specimen: BLOOD  Result Value Ref Range Status   Specimen Description   Final    BLOOD RIGHT ANTECUBITAL Performed at [facility], [address] [place], [place] [address]    Special Requests   Final    BOTTLES DRAWN AEROBIC AND ANAEROBIC Blood Culture adequate volume Performed at [facility], [address] [place], [place] [address]    Culture   Final    NO GROWTH 2 DAYS Performed at [facility], [address], [place], [place] [address]    Report Status PENDING  Incomplete    Studies/Results: CT ANGIO CHEST PE W OR WO CONTRAST  Result Date: 10/17/2019 CLINICAL DATA:  High probability for pulmonary embolism. History of substance abuse. EXAM: CT ANGIOGRAPHY CHEST WITH CONTRAST TECHNIQUE: Multidetector CT imaging of the chest was performed using the standard protocol during  bolus administration of intravenous contrast. Multiplanar CT image reconstructions and MIPs were obtained to evaluate the vascular anatomy. CONTRAST:  80mL OMNIPAQUE IOHEXOL 350 MG/ML SOLN COMPARISON:  None. FINDINGS: Cardiovascular: Enlarged appearance of the heart. No significant pericardial effusion. No pulmonary artery filling defect Mediastinum/Nodes: Negative for adenopathy or mass. Lungs/Pleura: Moderate layering pleural effusions. Streaky opacity in the lower lungs with volume loss more consistent with atelectasis than consolidation. Minimal and nonspecific ground-glass opacity in the subpleural left upper lobe. Mild generalized interlobular septal thickening best seen on coronal reformats Upper Abdomen: Intrahepatic venous reflux. Small volume ascites seen about the liver. Musculoskeletal: No acute or aggressive finding. Degenerative facet and endplate changes seen at T11-12. Review of the MIP images confirms the above findings. IMPRESSION: 1. Cardiomegaly with mild interstitial edema and moderate pleural effusions. Small volume ascites is also seen in the upper abdomen. 2. Pulmonary opacities with morphology suggesting atelectasis. 3. Negative for pulmonary embolism. Electronically Signed   By:  Marnee Spring M.D.   On: 10/17/2019 11:22   ECHOCARDIOGRAM COMPLETE  Result Date: 10/17/2019    ECHOCARDIOGRAM REPORT   Patient Name:   Alyssa Barnett Date of Exam: 10/17/2019 Medical Rec #:  [id number]         Height:       64.0 in Accession #:    [id number]        Weight:       125.0 lb Date of Birth:  1975/02/24         BSA:          1.602 m Patient Age:    44 years          BP:           137/92 mmHg Patient Gender: F                 HR:           73 bpm. Exam Location:  Inpatient Procedure: 2D Echo Indications:    CHF 428  History:        Patient has no prior history of Echocardiogram examinations.                 Risk Factors:Current Smoker. HIV. substance abuse.  Sonographer:    Celene Skeen RDCS (AE) Referring Phys: [id number] JENNIFER YATES  Sonographer Comments: patient had difficulty remaining awake during exam, as a result unable to follow directions at times. IMPRESSIONS  1. Left ventricular ejection fraction, by estimation, is 25 to 30%. The left ventricle has severely decreased function. The left ventricle demonstrates global hypokinesis. The left ventricular internal cavity size was mildly dilated. Left ventricular diastolic parameters are consistent with Grade II diastolic dysfunction (pseudonormalization).  2. Right ventricular systolic function is moderately reduced. The right ventricular size is mildly enlarged. Mildly increased right ventricular wall thickness.  3. Left atrial size was mildly dilated.  4. Right atrial size was mildly dilated.  5. A small pericardial effusion is present.  6. The mitral valve is grossly normal. Mild mitral valve regurgitation. No evidence of mitral stenosis.  7. The aortic valve is grossly normal. Aortic valve regurgitation is not visualized. No aortic stenosis is present. FINDINGS  Left Ventricle: Left ventricular ejection fraction, by estimation, is 25 to 30%. The left ventricle has severely decreased function. The left ventricle demonstrates global  hypokinesis. The left ventricular internal cavity size was mildly dilated. There is no left ventricular hypertrophy. Left ventricular diastolic parameters are consistent with Grade II diastolic dysfunction (pseudonormalization).  Right Ventricle: The right ventricular size is mildly enlarged. Mildly increased right ventricular wall thickness. Right ventricular systolic function is moderately reduced. Left Atrium: Left atrial size was mildly dilated. Right Atrium: Right atrial size was mildly dilated. Pericardium: A small pericardial effusion is present. Mitral Valve: The mitral valve is grossly normal. Mild mitral valve regurgitation. No evidence of mitral valve stenosis. Tricuspid Valve: The tricuspid valve is normal in structure. Tricuspid valve regurgitation is mild. Aortic Valve: The aortic valve is grossly normal. Aortic valve regurgitation is not visualized. No aortic stenosis is present. Pulmonic Valve: The pulmonic valve was grossly normal. Pulmonic valve regurgitation is not visualized. No evidence of pulmonic stenosis. Aorta: The aortic root and ascending aorta are structurally normal, with no evidence of dilitation. IAS/Shunts: The atrial septum is grossly normal.  LEFT VENTRICLE PLAX 2D LVIDd:         5.40 cm  Diastology LVIDs:         4.50 cm  LV e' medial:    4.90 cm/s LV PW:         1.30 cm  LV E/e' medial:  19.0 LV IVS:        1.00 cm  LV e' lateral:   9.90 cm/s LVOT diam:     1.80 cm  LV E/e' lateral: 9.4 LV SV:         42 LV SV Index:   26 LVOT Area:     2.54 cm  RIGHT VENTRICLE TAPSE (M-mode): 1.4 cm LEFT ATRIUM             Index LA diam:        2.70 cm 1.69 cm/m LA Vol (A2C):   63.4 ml 39.57 ml/m LA Vol (A4C):   59.6 ml 37.20 ml/m LA Biplane Vol: 61.1 ml 38.14 ml/m  AORTIC VALVE LVOT Vmax:   104.00 cm/s LVOT Vmean:  70.600 cm/s LVOT VTI:    0.166 m  AORTA Ao Root diam: 2.80 cm MITRAL VALVE MV Area (PHT): 4.60 cm    SHUNTS MV Decel Time: 165 msec    Systemic VTI:  0.17 m MV E velocity: 93.00  cm/s  Systemic Diam: 1.80 cm Kristeen Miss MD Electronically signed by Kristeen Miss MD Signature Date/Time: 10/17/2019/4:17:43 PM    Final      Assessment/Plan: HIV+ Chest pain LVEF 25-30% HTN Hyponatremia Cocaine abuse Homeless  Total days of antibiotics: (off) ceftriaxone azithro  off anbx for pneumonia Appreciate CV eval.  HIV RNA pending Withdrawal?                                          Sl worsening of Cr         Johny Sax MD, FACP Infectious Diseases (pager) [phone] [email or web address] 10/18/2019, 5:46 PM  LOS: 2 days

## 2019-10-18 NOTE — Plan of Care (Signed)
No signs/symptoms of sepsis this shift Problem: Education: Goal: Knowledge of General Education information will improve Description: Including pain rating scale, medication(s)/side effects and non-pharmacologic comfort measures Outcome: Progressing   Problem: Health Behavior/Discharge Planning: Goal: Ability to manage health-related needs will improve Outcome: Progressing

## 2019-10-18 NOTE — Progress Notes (Signed)
Medication was delivered to unit from outpatient pharmacy, medication is placed in the the pyxis in patients belonging under 1406. Please give to patient upon discharge.

## 2019-10-18 NOTE — Progress Notes (Signed)
PROGRESS NOTE  Alyssa Barnett  AVW:[id number] DOB: 1975-03-13 DOA: 10/16/2019 PCP: Patient, No Pcp Per   Brief Narrative: Alyssa Barnett is a 44 y.o. female with medical history significant of untreated HIV presenting with SOB.  She was seen in the ER on 8/31 with chest discomfort.  CXR with ?atypical infection, decreased O2 sat and started on [place] O2.  Elevated D-dimer, unable to tolerate CT.  Left AMA.  She returns with c/o pleuritic chest pain.  At the time of my evaluation, she was lying curled up and refused to answer questions other than to say "pain" repeatedly and intermittently say that her chest hurt.  She periodically screams out for the nurse due to pain. UDS +cocaine, THC. WBC wnl, afebrile. Lasix and antibiotics were started with ID consultation and the patient was admitted with CTA chest pending. This revealed cardiomegaly but no PE and no significant pulmonary infiltrate/pneumonia. Antibiotics are stopped and echocardiogram is pending.  Assessment & Plan: Principal Problem:   Acquired immunodeficiency syndrome (AIDS) with bacterial pneumonia (HCC) Active Problems:   New onset of congestive heart failure (HCC)   Polysubstance abuse (HCC)   Tobacco dependence  HIV disease: Untreated.  - Restarted ART per ID - Viral load pending. CD4 count is 476. Will need continued follow up.  - Quantiferon gold pending, RPR NR.  Acute combined CHF: Suspected to be cause of opacities on radiologic evaluation with cardiomegaly, elevated BNP, interstitial edema. Echo showed EF 25-30%, global hypokinesis, G2DD, moderately reduced RV systolic function. 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 Infiltrative process is possible  - s/p IV lasix, holding with Cr rise.  - Monitor I/O, daily weights. Incompletely documented UOP and weights on different scales not reliable (118-125lbs) - Avoiding BB for now  w/+cocaine.   AKI:  - Will hold lasix pending cardiology evaluation - Stop toradol, has been getting this consistently ~q6h. Sub tramadol.  Pneumonia ruled out: No consolidation on CXR, PCT and CRP and WBC negative, afebrile.  - DC abx  Troponin elevation: In setting of suspected CHF and cocaine use. Mildly elevated without overtly ischemic ECG changes.  - Avoid cocaine - Cardiology consulted as above. Not currently planning ischemic evaluation.  Polysubstance abuse - Continue COWS protocol and clonidine.  - Narcotics not currently indicated for treatment of pain as above.  Tobacco dependence - Continue cessation counseling - Nicotine patch if needed.   DVT prophylaxis: Lovenox  daily Code Status: Full Family Communication: None at bedside Disposition Plan:  Status is: Inpatient  Remains inpatient appropriate because:Ongoing diagnostic testing needed not appropriate for outpatient work up  Dispo: The patient is from: [facility]              Anticipated d/c is to: [facility]              Anticipated d/c date is: 1 day pending clearance by ID, cardiology.              Patient currently is not medically stable to d/c.  Consultants:   ID  Cardiology  Procedures:   Echocardiogram 10/17/2019 1. Left ventricular ejection fraction, by estimation, is 25 to 30%. The  left ventricle has severely decreased function. The left ventricle  demonstrates global hypokinesis. The left ventricular internal cavity size  was mildly dilated. Left ventricular  diastolic parameters are consistent with Grade II diastolic dysfunction  (pseudonormalization).  2. Right ventricular systolic function is moderately reduced. The right  ventricular size is mildly enlarged. Mildly increased right ventricular  wall thickness.  3. Left atrial size was mildly dilated.  4. Right atrial size was mildly dilated.  5. A small pericardial effusion is present.  6. The mitral valve is grossly normal. Mild  mitral valve regurgitation.  No evidence of mitral stenosis.  7. The aortic valve is grossly normal. Aortic valve regurgitation is not  visualized. No aortic stenosis is present.   Antimicrobials:  Ceftriaxone, azithromycin  ART   Subjective: Continues to have unchanged pain, requesting IV pain medications only. No changes. Made good UOP yesterday. She is laying completely flat and denies current orthopnea "that has gotten better."   Objective: Vitals:   10/17/19 1729 10/17/19 2053 10/18/19 0513 10/18/19 0600  BP: (!) 128/98 114/73 123/90   Pulse: 84 72 90   Resp: 20 18 (!) 22   Temp: 98.6 F (37 C) 98.4 F (36.9 C) 98.1 F (36.7 C)   TempSrc: Oral     SpO2: 95% 95% 95%   Weight:    56.7 kg  Height:        Intake/Output Summary (Last 24 hours) at 10/18/2019 0651 Last data filed at 10/18/2019 0600 Gross per 24 hour  Intake 1061.66 ml  Output 1250 ml  Net -188.34 ml   Filed Weights   10/17/19 1359 10/18/19 0600  Weight: 53.6 kg 56.7 kg   Gen: 44 y.o. female in no distress Pulm: Nonlabored breathing room air. Clear. CV: Regular rate and rhythm. No definite murmur, rub, or gallop. No JVD, no dependent edema. GI: Abdomen soft, non-tender, non-distended, with normoactive bowel sounds.  Ext: Warm, no deformities Skin: No new rashes, lesions or ulcers on visualized skin. Neuro: Alert and oriented. No focal neurological deficits. Psych: Judgement and insight appear fair.   Data Reviewed: I have personally reviewed following labs and imaging studies  CBC: Recent Labs  Lab 10/16/19 1332 10/17/19 0630  WBC 5.3 4.3  NEUTROABS  --  2.5  HGB 12.6 12.6  HCT 39.4 39.5  MCV 81.6 83.9  PLT 173 152   Basic Metabolic Panel: Recent Labs  Lab 10/16/19 1332 10/17/19 0630 10/18/19 0453  NA 128* 131* 131*  K 5.7* 3.7 3.6  CL 98 103 95*  CO2 19* 20* 24  GLUCOSE 134* 112* 130*  BUN 15 19 19   CREATININE 1.08* 1.17* 1.55*  CALCIUM 8.5* 7.5* 8.0*   GFR: Estimated  Creatinine Clearance: 40.8 mL/min (A) (by C-G formula based on SCr of 1.55 mg/dL (H)). Liver Function Tests: Recent Labs  Lab 10/16/19 1332  AST 62*  ALT 22  ALKPHOS 54  BILITOT 0.8  PROT 6.8  ALBUMIN 2.8*   Recent Labs  Lab 10/16/19 1332  LIPASE 26   No results for input(s): AMMONIA in the last 168 hours. Coagulation Profile: No results for input(s): INR, PROTIME in the last 168 hours. Cardiac Enzymes: No results for input(s): CKTOTAL, CKMB, CKMBINDEX, TROPONINI in the last 168 hours. BNP (last 3 results) No results for input(s): PROBNP in the last 8760 hours. HbA1C: No results for input(s): HGBA1C in the last 72 hours. CBG: No results for input(s): GLUCAP in the last 168 hours. Lipid Profile: Recent Labs    10/16/19 1358 10/17/19 0630  CHOL  --  99  HDL  --  20*  LDLCALC  --  59  TRIG 103 100  CHOLHDL  --  5.0   Thyroid Function Tests: No results for input(s): TSH, T4TOTAL, FREET4, T3FREE, THYROIDAB in the  last 72 hours. Anemia Panel: Recent Labs    10/16/19 1358  FERRITIN 25   Urine analysis:    Component Value Date/Time   COLORURINE STRAW (A) 10/16/2019 1728   APPEARANCEUR CLEAR 10/16/2019 1728   LABSPEC 1.009 10/16/2019 1728   PHURINE 6.0 10/16/2019 1728   GLUCOSEU NEGATIVE 10/16/2019 1728   HGBUR SMALL (A) 10/16/2019 1728   BILIRUBINUR NEGATIVE 10/16/2019 1728   KETONESUR NEGATIVE 10/16/2019 1728   PROTEINUR 100 (A) 10/16/2019 1728   NITRITE NEGATIVE 10/16/2019 1728   LEUKOCYTESUR NEGATIVE 10/16/2019 1728   Recent Results (from the past 240 hour(s))  SARS Coronavirus 2 by RT PCR (hospital order, performed in [facility] hospital lab) Nasopharyngeal Nasopharyngeal Swab     Status: None   Collection Time: 10/16/19  1:32 PM   Specimen: Nasopharyngeal Swab  Result Value Ref Range Status   SARS Coronavirus 2 NEGATIVE NEGATIVE Final    Comment: (NOTE) SARS-CoV-2 target nucleic acids are NOT DETECTED.  The SARS-CoV-2 RNA is generally detectable in  upper and lower respiratory specimens during the acute phase of infection. The lowest concentration of SARS-CoV-2 viral copies this assay can detect is 250 copies / mL. A negative result does not preclude SARS-CoV-2 infection and should not be used as the sole basis for treatment or other patient management decisions.  A negative result may occur with improper specimen collection / handling, submission of specimen other than nasopharyngeal swab, presence of viral mutation(s) within the areas targeted by this assay, and inadequate number of viral copies (<250 copies / mL). A negative result must be combined with clinical observations, patient history, and epidemiological information.  Fact Sheet for Patients:   BoilerBrush.com.cy  Fact Sheet for Healthcare Providers: https://pope.com/  This test is not yet approved or  cleared by the [place] FDA and has been authorized for detection and/or diagnosis of SARS-CoV-2 by FDA under an Emergency Use Authorization (EUA).  This EUA will remain in effect (meaning this test can be used) for the duration of the COVID-19 declaration under Section 564(b)(1) of the Act, 21 U.S.C. section 360bbb-3(b)(1), unless the authorization is terminated or revoked sooner.  Performed at [facility], [address] [place], [place] [address]   Blood Culture (routine x 2)     Status: None (Preliminary result)   Collection Time: 10/16/19  1:58 PM   Specimen: BLOOD  Result Value Ref Range Status   Specimen Description   Final    BLOOD RIGHT UPPER ARM Performed at [facility], [address] [place], [place] [address]    Special Requests   Final    BOTTLES DRAWN AEROBIC AND ANAEROBIC Blood Culture results may not be optimal due to an excessive volume of blood received in culture bottles Performed at [facility], [address], [place], [place]  [address]    Culture   Final    NO GROWTH < 24 HOURS Performed at [facility], [address] [place], [place] [address]    Report Status PENDING  Incomplete  Blood Culture (routine x 2)     Status: None (Preliminary result)   Collection Time: 10/16/19  1:58 PM   Specimen: BLOOD  Result Value Ref Range Status   Specimen Description   Final    BLOOD RIGHT ANTECUBITAL Performed at [facility], [address] [place], [place] [address]    Special Requests   Final    BOTTLES DRAWN AEROBIC AND ANAEROBIC Blood Culture  adequate volume Performed at [facility], [address], [place], [place] [address]    Culture   Final    NO GROWTH < 24 HOURS Performed at [facility], [address] [place], [place] [address]    Report Status PENDING  Incomplete      Radiology Studies: DG Chest 2 View  Result Date: 10/16/2019 CLINICAL DATA:  Pneumonia, productive cough. EXAM: CHEST - 2 VIEW COMPARISON:  10/04/2019 FINDINGS: Stable top-normal heart size. Worsening bilateral lower lobe no filtrates with associated small bilateral pleural effusions. Possible overlying mild pulmonary interstitial edema. No pneumothorax. IMPRESSION: Worsening bilateral lower lobe infiltrates with associated small bilateral pleural effusions. Possible mild overlying pulmonary interstitial edema. Electronically Signed   By: Irish Lack M.D.   On: 10/16/2019 11:33   CT ANGIO CHEST PE W OR WO CONTRAST  Result Date: 10/17/2019 CLINICAL DATA:  High probability for pulmonary embolism. History of substance abuse. EXAM: CT ANGIOGRAPHY CHEST WITH CONTRAST TECHNIQUE: Multidetector CT imaging of the chest was performed using the standard protocol during bolus administration of intravenous contrast. Multiplanar CT image reconstructions and MIPs were obtained to evaluate the vascular anatomy. CONTRAST:  52mL OMNIPAQUE IOHEXOL 350 MG/ML SOLN COMPARISON:  None. FINDINGS: Cardiovascular:  Enlarged appearance of the heart. No significant pericardial effusion. No pulmonary artery filling defect Mediastinum/Nodes: Negative for adenopathy or mass. Lungs/Pleura: Moderate layering pleural effusions. Streaky opacity in the lower lungs with volume loss more consistent with atelectasis than consolidation. Minimal and nonspecific ground-glass opacity in the subpleural left upper lobe. Mild generalized interlobular septal thickening best seen on coronal reformats Upper Abdomen: Intrahepatic venous reflux. Small volume ascites seen about the liver. Musculoskeletal: No acute or aggressive finding. Degenerative facet and endplate changes seen at T11-12. Review of the MIP images confirms the above findings. IMPRESSION: 1. Cardiomegaly with mild interstitial edema and moderate pleural effusions. Small volume ascites is also seen in the upper abdomen. 2. Pulmonary opacities with morphology suggesting atelectasis. 3. Negative for pulmonary embolism. Electronically Signed   By: Marnee Spring M.D.   On: 10/17/2019 11:22   ECHOCARDIOGRAM COMPLETE  Result Date: 10/17/2019    ECHOCARDIOGRAM REPORT   Patient Name:   Alyssa Barnett Date of Exam: 10/17/2019 Medical Rec #:  [id number]         Height:       64.0 in Accession #:    [id number]        Weight:       125.0 lb Date of Birth:  1975-07-03         BSA:          1.602 m Patient Age:    44 years          BP:           137/92 mmHg Patient Gender: F                 HR:           73 bpm. Exam Location:  Inpatient Procedure: 2D Echo Indications:    CHF 428  History:        Patient has no prior history of Echocardiogram examinations.                 Risk Factors:Current Smoker. HIV. substance abuse.  Sonographer:    Celene Skeen RDCS (AE) Referring Phys: [id number] JENNIFER YATES  Sonographer Comments: patient had difficulty remaining awake during exam, as a result unable to follow directions at times. IMPRESSIONS  1. Left ventricular ejection fraction, by estimation, is 25  to 30%. The left ventricle has severely decreased function. The left ventricle demonstrates global hypokinesis. The left ventricular internal cavity size was mildly dilated. Left ventricular diastolic parameters are consistent with Grade II diastolic dysfunction (pseudonormalization).  2. Right ventricular systolic function is moderately reduced. The right ventricular size is mildly enlarged. Mildly increased right ventricular wall thickness.  3. Left atrial size was mildly dilated.  4. Right atrial size was mildly dilated.  5. A small pericardial effusion is present.  6. The mitral valve is grossly normal. Mild mitral valve regurgitation. No evidence of mitral stenosis.  7. The aortic valve is grossly normal. Aortic valve regurgitation is not visualized. No aortic stenosis is present. FINDINGS  Left Ventricle: Left ventricular ejection fraction, by estimation, is 25 to 30%. The left ventricle has severely decreased function. The left ventricle demonstrates global hypokinesis. The left ventricular internal cavity size was mildly dilated. There is no left ventricular hypertrophy. Left ventricular diastolic parameters are consistent with Grade II diastolic dysfunction (pseudonormalization). Right Ventricle: The right ventricular size is mildly enlarged. Mildly increased right ventricular wall thickness. Right ventricular systolic function is moderately reduced. Left Atrium: Left atrial size was mildly dilated. Right Atrium: Right atrial size was mildly dilated. Pericardium: A small pericardial effusion is present. Mitral Valve: The mitral valve is grossly normal. Mild mitral valve regurgitation. No evidence of mitral valve stenosis. Tricuspid Valve: The tricuspid valve is normal in structure. Tricuspid valve regurgitation is mild. Aortic Valve: The aortic valve is grossly normal. Aortic valve regurgitation is not visualized. No aortic stenosis is present. Pulmonic Valve: The pulmonic valve was grossly normal. Pulmonic  valve regurgitation is not visualized. No evidence of pulmonic stenosis. Aorta: The aortic root and ascending aorta are structurally normal, with no evidence of dilitation. IAS/Shunts: The atrial septum is grossly normal.  LEFT VENTRICLE PLAX 2D LVIDd:         5.40 cm  Diastology LVIDs:         4.50 cm  LV e' medial:    4.90 cm/s LV PW:         1.30 cm  LV E/e' medial:  19.0 LV IVS:        1.00 cm  LV e' lateral:   9.90 cm/s LVOT diam:     1.80 cm  LV E/e' lateral: 9.4 LV SV:         42 LV SV Index:   26 LVOT Area:     2.54 cm  RIGHT VENTRICLE TAPSE (M-mode): 1.4 cm LEFT ATRIUM             Index LA diam:        2.70 cm 1.69 cm/m LA Vol (A2C):   63.4 ml 39.57 ml/m LA Vol (A4C):   59.6 ml 37.20 ml/m LA Biplane Vol: 61.1 ml 38.14 ml/m  AORTIC VALVE LVOT Vmax:   104.00 cm/s LVOT Vmean:  70.600 cm/s LVOT VTI:    0.166 m  AORTA Ao Root diam: 2.80 cm MITRAL VALVE MV Area (PHT): 4.60 cm    SHUNTS MV Decel Time: 165 msec    Systemic VTI:  0.17 m MV E velocity: 93.00 cm/s  Systemic Diam: 1.80 cm Kristeen Miss MD Electronically signed by Kristeen Miss MD Signature Date/Time: 10/17/2019/4:17:43 PM    Final     Scheduled Meds: . aspirin EC  81 mg Oral Daily  . cloNIDine  0.1 mg Oral QID   Followed by  . [  START ON 10/19/2019] cloNIDine  0.1 mg Oral BH-qamhs   Followed by  . [START ON 10/21/2019] cloNIDine  0.1 mg Oral QAC breakfast  . Darunavir-Cobicisctat-Emtricitabine-Tenofovir Alafenamide  1 tablet Oral Q breakfast  . enoxaparin (LOVENOX) injection  40 mg Subcutaneous Q24H  . nicotine  14 mg Transdermal Daily  . sodium chloride flush  3 mL Intravenous Q12H   Continuous Infusions: . sodium chloride       LOS: 2 days   Time spent: 25 minutes.  Tyrone Nine, MD [facility] [email or web address] 10/18/2019, 6:51 AM

## 2019-10-18 NOTE — TOC Progression Note (Signed)
Transition of Care [facility]) - Progression Note    Patient Details  Name: Alyssa Barnett MRN: [id number] Date of Birth: 02-15-1975  Transition of Care [facility]) CM/SW Contact  Robbyn Hodkinson, Olegario Messier, RN Phone Number: 10/18/2019, 12:36 PM  Clinical Narrative: See prior CSW note. Patient has been screened per prior note-d/c plan return back to [facility]. Will await patient to be more alert to confirm d/c plan.        Barriers to Discharge: Barriers Resolved  Expected Discharge Plan and Services                                                 Social Determinants of Health (SDOH) Interventions    Readmission Risk Interventions No flowsheet data found.

## 2019-10-18 NOTE — Consult Note (Addendum)
Cardiology Consultation:   Patient ID: Alyssa Barnett; [id number]; [birth year]   Admit date: 10/16/2019 Date of Consult: 10/18/2019  Primary Care Provider: Patient, No Pcp Per Primary Cardiologist: New to [facility]   Patient Profile:   Alyssa Barnett is a 44 y.o. female with a hx of untreated HIV and polysubstance abuse who is being seen today for the evaluation of chest pain and new systolic CHF at the request of Dr. Jarvis Newcomer.  History of Present Illness:   Alyssa Barnett is a 44 year old female with a history as stated above who initially presented to the ER 10/02/2019 with chest discomfort at which time a CXR showed questionable atypical infection. hsT and D-dimer were elevated at 34 and 1.00 therefore she attempted to undergo CTA however was unable to tolerate and ended up leaving AMA. She then returned 10/16/2019 with worsening shortness of breath and abdominal swelling found to be fluid volume overloaded on exam.    On ED presentation, K+ noted to be elevated at 5.7. BNP elevated at 1929 with subsequent CXR showing worsening bilateral lower lobe infiltrates with associated small bilateral pleural effusions and mild overlying pulmonary interstitial edema.  Repeat D-dimer elevated once again at 2.58 with a fibrinogen level at 283.  CTA showed cardiomegaly with mild interstitial edema and moderate pleural effusions with small volume ascites, pulmonary opacities with morphology suggesting atelectasis.  Study negative for pulmonary embolism.  On my exam, she states that she is originally from [facility] and left to get away from her ex-husband. She is currently homeless and lives in a hotel. She has no immediate family support here in [place]. She reports that for the last several weeks, she has been experiencing severe exertional SOB and inspirational chest pain. She has no personal hx of CAD however reports family hx with MI in her father but cannot determine his age. She continues to use cocaine, reportedly  only infrequently. She states that she continues to use tobacco as well.    Given concern for new onset CHF echocardiogram was performed which showed an LVEF at 25 to 30% with global hypokinesis, grade 2 DD, moderate RV failure, mildly dilated LA/RA, small pericardial effusion, mild MR and no AS.   Cardiology been consulted given above results.  Past Medical History:  Diagnosis Date  . HIV (human immunodeficiency virus infection) (HCC)     History reviewed. No pertinent surgical history.   Prior to Admission medications   Medication Sig Start Date End Date Taking? Authorizing Provider  Darunavir-Cobicisctat-Emtricitabine-Tenofovir Alafenamide (SYMTUZA) 800-150-200-10 MG TABS Take 1 tablet by mouth daily with breakfast. 10/18/19   Tyrone Nine, MD    Inpatient Medications: Scheduled Meds: . aspirin EC  81 mg Oral Daily  . cloNIDine  0.1 mg Oral QID   Followed by  . [START ON 10/19/2019] cloNIDine  0.1 mg Oral BH-qamhs   Followed by  . [START ON 10/21/2019] cloNIDine  0.1 mg Oral QAC breakfast  . Darunavir-Cobicisctat-Emtricitabine-Tenofovir Alafenamide  1 tablet Oral Q breakfast  . enoxaparin (LOVENOX) injection  40 mg Subcutaneous Q24H  . nicotine  14 mg Transdermal Daily  . sodium chloride flush  3 mL Intravenous Q12H   Continuous Infusions: . sodium chloride     PRN Meds: sodium chloride, dicyclomine, hydrOXYzine, loperamide, methocarbamol, ondansetron, sodium chloride flush, traMADol  Allergies:    Allergies  Allergen Reactions  . Flagyl [Metronidazole] Nausea And Vomiting    Pt states severe, violent vomiting.  . Cefaclor Itching    Social History:  Social History   Socioeconomic History  . Marital status: Married    Spouse name: Not on file  . Number of children: Not on file  . Years of education: Not on file  . Highest education level: Not on file  Occupational History  . Not on file  Tobacco Use  . Smoking status: Current Every Day Smoker  . Smokeless  tobacco: Never Used  Vaping Use  . Vaping Use: Never used  Substance and Sexual Activity  . Alcohol use: Yes  . Drug use: Yes    Types: Marijuana, Cocaine    Comment: crack   . Sexual activity: Not on file  Other Topics Concern  . Not on file  Social History Narrative  . Not on file   Social Determinants of Health   Financial Resource Strain:   . Difficulty of Paying Living Expenses: Not on file  Food Insecurity:   . Worried About Programme researcher, broadcasting/film/video in the Last Year: Not on file  . Ran Out of Food in the Last Year: Not on file  Transportation Needs:   . Lack of Transportation (Medical): Not on file  . Lack of Transportation (Non-Medical): Not on file  Physical Activity:   . Days of Exercise per Week: Not on file  . Minutes of Exercise per Session: Not on file  Stress:   . Feeling of Stress : Not on file  Social Connections:   . Frequency of Communication with Friends and Family: Not on file  . Frequency of Social Gatherings with Friends and Family: Not on file  . Attends Religious Services: Not on file  . Active Member of Clubs or Organizations: Not on file  . Attends Banker Meetings: Not on file  . Marital Status: Not on file  Intimate Partner Violence:   . Fear of Current or Ex-Partner: Not on file  . Emotionally Abused: Not on file  . Physically Abused: Not on file  . Sexually Abused: Not on file    Family History:   Family History  Problem Relation Age of Onset  . Diabetes Father   . CAD Father    Family Status:  Family Status  Relation Name Status  . Father  (Not Specified)    ROS:  Please see the history of present illness.  All other ROS reviewed and negative.     Physical Exam/Data:   Vitals:   10/17/19 1729 10/17/19 2053 10/18/19 0513 10/18/19 0600  BP: (!) 128/98 114/73 123/90   Pulse: 84 72 90   Resp: 20 18 (!) 22   Temp: 98.6 F (37 C) 98.4 F (36.9 C) 98.1 F (36.7 C)   TempSrc: Oral     SpO2: 95% 95% 95%   Weight:     56.7 kg  Height:        Intake/Output Summary (Last 24 hours) at 10/18/2019 0952 Last data filed at 10/18/2019 0944 Gross per 24 hour  Intake 1424.66 ml  Output 1250 ml  Net 174.66 ml   Filed Weights   10/17/19 1359 10/18/19 0600  Weight: 53.6 kg 56.7 kg   Body mass index is 21.46 kg/m.   General: Older than stated age with mild distress Neck: Negative for carotid bruits. No JVD Lungs:Bilateral upper and lower lobe crackles. Breathing is labored. Cardiovascular: RRR with S1 S2. No murmurs Abdomen: Soft, non-tender, distended. No obvious abdominal masses. Extremities: No edema. Radial pulses 2+ bilaterally Neuro: Alert and oriented. No focal deficits. No facial asymmetry. MAE  spontaneously. Psych: Responds to questions appropriately with normal affect.    EKG:  The EKG was personally reviewed and demonstrates: 10/16/19 NSR with HR 71bpm with nonspecific T wave abnormalities, anterolateral Q waves  Telemetry:  Telemetry was personally reviewed and demonstrates:  10/18/19 ST with HR 90's   Relevant CV Studies:  Echocardiogram 10/17/19:  1. Left ventricular ejection fraction, by estimation, is 25 to 30%. The  left ventricle has severely decreased function. The left ventricle  demonstrates global hypokinesis. The left ventricular internal cavity size  was mildly dilated. Left ventricular  diastolic parameters are consistent with Grade II diastolic dysfunction  (pseudonormalization).  2. Right ventricular systolic function is moderately reduced. The right  ventricular size is mildly enlarged. Mildly increased right ventricular  wall thickness.  3. Left atrial size was mildly dilated.  4. Right atrial size was mildly dilated.  5. A small pericardial effusion is present.  6. The mitral valve is grossly normal. Mild mitral valve regurgitation.  No evidence of mitral stenosis.  7. The aortic valve is grossly normal. Aortic valve regurgitation is not  visualized. No aortic  stenosis is present.   Laboratory Data:  Chemistry Recent Labs  Lab 10/16/19 1332 10/17/19 0630 10/18/19 0453  NA 128* 131* 131*  K 5.7* 3.7 3.6  CL 98 103 95*  CO2 19* 20* 24  GLUCOSE 134* 112* 130*  BUN 15 19 19   CREATININE 1.08* 1.17* 1.55*  CALCIUM 8.5* 7.5* 8.0*  GFRNONAA >60 57* 41*  GFRAA >60 >60 47*  ANIONGAP 11 8 12     Total Protein  Date Value Ref Range Status  10/16/2019 6.8 6.5 - 8.1 g/dL Final   Albumin  Date Value Ref Range Status  10/16/2019 2.8 (L) 3.5 - 5.0 g/dL Final   AST  Date Value Ref Range Status  10/16/2019 62 (H) 15 - 41 U/L Final   ALT  Date Value Ref Range Status  10/16/2019 22 0 - 44 U/L Final   Alkaline Phosphatase  Date Value Ref Range Status  10/16/2019 54 38 - 126 U/L Final   Total Bilirubin  Date Value Ref Range Status  10/16/2019 0.8 0.3 - 1.2 mg/dL Final   Hematology Recent Labs  Lab 10/16/19 1332 10/17/19 0630  WBC 5.3 4.3  RBC 4.83 4.71  HGB 12.6 12.6  HCT 39.4 39.5  MCV 81.6 83.9  MCH 26.1 26.8  MCHC 32.0 31.9  RDW 14.1 13.9  PLT 173 152   Cardiac EnzymesNo results for input(s): TROPONINI in the last 168 hours. No results for input(s): TROPIPOC in the last 168 hours.  BNP Recent Labs  Lab 10/16/19 1332  BNP 1,929.2*    DDimer  Recent Labs  Lab 10/16/19 1358  DDIMER 2.58*   TSH: No results found for: TSH Lipids: Lab Results  Component Value Date   CHOL 99 10/17/2019   HDL 20 (L) 10/17/2019   LDLCALC 59 10/17/2019   TRIG 100 10/17/2019   CHOLHDL 5.0 10/17/2019   HgbA1c:No results found for: HGBA1C  Radiology/Studies:  DG Chest 2 View  Result Date: 10/16/2019 CLINICAL DATA:  Pneumonia, productive cough. EXAM: CHEST - 2 VIEW COMPARISON:  10/04/2019 FINDINGS: Stable top-normal heart size. Worsening bilateral lower lobe no filtrates with associated small bilateral pleural effusions. Possible overlying mild pulmonary interstitial edema. No pneumothorax. IMPRESSION: Worsening bilateral lower lobe  infiltrates with associated small bilateral pleural effusions. Possible mild overlying pulmonary interstitial edema. Electronically Signed   By: 10/18/2019 M.D.   On: 10/16/2019 11:33  CT ANGIO CHEST PE W OR WO CONTRAST  Result Date: 10/17/2019 CLINICAL DATA:  High probability for pulmonary embolism. History of substance abuse. EXAM: CT ANGIOGRAPHY CHEST WITH CONTRAST TECHNIQUE: Multidetector CT imaging of the chest was performed using the standard protocol during bolus administration of intravenous contrast. Multiplanar CT image reconstructions and MIPs were obtained to evaluate the vascular anatomy. CONTRAST:  74mL OMNIPAQUE IOHEXOL 350 MG/ML SOLN COMPARISON:  None. FINDINGS: Cardiovascular: Enlarged appearance of the heart. No significant pericardial effusion. No pulmonary artery filling defect Mediastinum/Nodes: Negative for adenopathy or mass. Lungs/Pleura: Moderate layering pleural effusions. Streaky opacity in the lower lungs with volume loss more consistent with atelectasis than consolidation. Minimal and nonspecific ground-glass opacity in the subpleural left upper lobe. Mild generalized interlobular septal thickening best seen on coronal reformats Upper Abdomen: Intrahepatic venous reflux. Small volume ascites seen about the liver. Musculoskeletal: No acute or aggressive finding. Degenerative facet and endplate changes seen at T11-12. Review of the MIP images confirms the above findings. IMPRESSION: 1. Cardiomegaly with mild interstitial edema and moderate pleural effusions. Small volume ascites is also seen in the upper abdomen. 2. Pulmonary opacities with morphology suggesting atelectasis. 3. Negative for pulmonary embolism. Electronically Signed   By: Marnee Spring M.D.   On: 10/17/2019 11:22   ECHOCARDIOGRAM COMPLETE  Result Date: 10/17/2019    ECHOCARDIOGRAM REPORT   Patient Name:   [facility] Romer Date of Exam: 10/17/2019 Medical Rec #:  [id number]         Height:       64.0 in  Accession #:    [id number]        Weight:       125.0 lb Date of Birth:  1975-02-19         BSA:          1.602 m Patient Age:    44 years          BP:           137/92 mmHg Patient Gender: F                 HR:           73 bpm. Exam Location:  Inpatient Procedure: 2D Echo Indications:    CHF 428  History:        Patient has no prior history of Echocardiogram examinations.                 Risk Factors:Current Smoker. HIV. substance abuse.  Sonographer:    Celene Skeen RDCS (AE) Referring Phys: [id number] JENNIFER YATES  Sonographer Comments: patient had difficulty remaining awake during exam, as a result unable to follow directions at times. IMPRESSIONS  1. Left ventricular ejection fraction, by estimation, is 25 to 30%. The left ventricle has severely decreased function. The left ventricle demonstrates global hypokinesis. The left ventricular internal cavity size was mildly dilated. Left ventricular diastolic parameters are consistent with Grade II diastolic dysfunction (pseudonormalization).  2. Right ventricular systolic function is moderately reduced. The right ventricular size is mildly enlarged. Mildly increased right ventricular wall thickness.  3. Left atrial size was mildly dilated.  4. Right atrial size was mildly dilated.  5. A small pericardial effusion is present.  6. The mitral valve is grossly normal. Mild mitral valve regurgitation. No evidence of mitral stenosis.  7. The aortic valve is grossly normal. Aortic valve regurgitation is not visualized. No aortic stenosis is present. FINDINGS  Left Ventricle: Left ventricular ejection fraction, by estimation, is  25 to 30%. The left ventricle has severely decreased function. The left ventricle demonstrates global hypokinesis. The left ventricular internal cavity size was mildly dilated. There is no left ventricular hypertrophy. Left ventricular diastolic parameters are consistent with Grade II diastolic dysfunction (pseudonormalization). Right Ventricle: The  right ventricular size is mildly enlarged. Mildly increased right ventricular wall thickness. Right ventricular systolic function is moderately reduced. Left Atrium: Left atrial size was mildly dilated. Right Atrium: Right atrial size was mildly dilated. Pericardium: A small pericardial effusion is present. Mitral Valve: The mitral valve is grossly normal. Mild mitral valve regurgitation. No evidence of mitral valve stenosis. Tricuspid Valve: The tricuspid valve is normal in structure. Tricuspid valve regurgitation is mild. Aortic Valve: The aortic valve is grossly normal. Aortic valve regurgitation is not visualized. No aortic stenosis is present. Pulmonic Valve: The pulmonic valve was grossly normal. Pulmonic valve regurgitation is not visualized. No evidence of pulmonic stenosis. Aorta: The aortic root and ascending aorta are structurally normal, with no evidence of dilitation. IAS/Shunts: The atrial septum is grossly normal.  LEFT VENTRICLE PLAX 2D LVIDd:         5.40 cm  Diastology LVIDs:         4.50 cm  LV e' medial:    4.90 cm/s LV PW:         1.30 cm  LV E/e' medial:  19.0 LV IVS:        1.00 cm  LV e' lateral:   9.90 cm/s LVOT diam:     1.80 cm  LV E/e' lateral: 9.4 LV SV:         42 LV SV Index:   26 LVOT Area:     2.54 cm  RIGHT VENTRICLE TAPSE (M-mode): 1.4 cm LEFT ATRIUM             Index LA diam:        2.70 cm 1.69 cm/m LA Vol (A2C):   63.4 ml 39.57 ml/m LA Vol (A4C):   59.6 ml 37.20 ml/m LA Biplane Vol: 61.1 ml 38.14 ml/m  AORTIC VALVE LVOT Vmax:   104.00 cm/s LVOT Vmean:  70.600 cm/s LVOT VTI:    0.166 m  AORTA Ao Root diam: 2.80 cm MITRAL VALVE MV Area (PHT): 4.60 cm    SHUNTS MV Decel Time: 165 msec    Systemic VTI:  0.17 m MV E velocity: 93.00 cm/s  Systemic Diam: 1.80 cm Kristeen Miss MD Electronically signed by Kristeen Miss MD Signature Date/Time: 10/17/2019/4:17:43 PM    Final    Assessment and Plan:   1.  New acute combined systolic and diastolic CHF/cardiomyopathy: -Patient  initially seen in the ER 10/02/2019 with chest discomfort at which time a CXR showed questionable atypical infection. hsT was elevated and D-dimer were elevated at 34 and 1.00 therefore she attempted to undergo CTA however was unable to tolerate and ended up leaving AMA.  She then returned 10/16/2019 with worsening shortness of breath apparently she was at [facility], ED 10/11/2019 however left before being seen. -BNP found to be elevated at 1929 with subsequent CXR showing worsening bilateral lower lobe infiltrates with associated small bilateral pleural effusions and mild overlying pulmonary interstitial edema. Repeat D-dimer elevated once again at 2.58 with a fibrinogen level at 283. CTA showed cardiomegaly with mild interstitial edema and moderate pleural effusions with small volume ascites, pulmonary opacities with morphology suggesting atelectasis.  Study negative for pulmonary embolism. -Echocardiogram showed an LVEF at 25 to 30% with global hypokinesis, grade  2 DD, moderate RV failure, mildly dilated LA/RA, small pericardial effusion, mild MR and no AS.  -LDL, 59 -Given renal dysfunction, not a candidate for ACEI/ARB/ARNI at this time however once renal function improved, will consider therapies for cardiomyopathy -Likely new systolic dysfunction in the setting of polysubstance/cocaine abuse however cannot fully rule out coronary involvement.  -Not currently a candidate for invasive assessment given poor respiratory status. Will need to consider compliance prior to invasive workup as well as she has a known hx of poor medication and follow up.  -Would consider challenging her with IV Lasix and follow further response>>will discuss with MD for final recommendations   2.  Untreated HIV versus ARDS with possible acute PNA: -Per chart review, patient followed at [facility]>>.  Last seen 07/2019 -Currently being treated with Rocephin and azithromycin with plans to follow viral load, CD4 and other lab  work -ID has been consulted per primary team -Management per ID/IM  3.  Polysubstance abuse: -UDS positive for cocaine, THC -History of leaving AMA -Poor compliance  4.  Tobacco use: -Cessation strongly encouraged  5.  AKI: -Initial creatinine on 10/16/2019 at 1.08 however creatinine today at 1.55 -Follow closely   For questions or updates, please contact [facility] Please consult [email or web address] for contact info under Cardiology/STEMI.   SignedGeorgie Chard NP-C [facility] Pager: [phone] 10/18/2019 9:52 AM  I have seen and examined the patient along with Georgie Chard NP-C.  I have reviewed the chart, notes and new data.  I agree with NP's note.  Key new complaints: very uncooperative, feels too cold to be examined Key examination changes: RRR, no murmurs, no JVD, clear lungs. Able to lie fully horizontal without dyspnea. No edema. Key new findings / data: echo reviewed. She has LVH and a hyperechoic myocardium, circumferential pericardial effusion restrictive filling pattern. CD4 count 476.  PLAN: Consider infiltrative CMP due to echo findings (check myeloma panel) versus cocaine-related CMP (by far the most likely diagnosis). CAD unlikely with global dysfunction, absence of any aortic or coronary atherosclerotic calcifications, young age and otherwise relatively low coronary risk profile. HIV disease not consistently treated, but CD4 is quite high, so HIV cardiomyopathy is unlikely.  I do not think that she needs further aggressive diuresis. Place on daily dose of oral diuretic starting tomorrow (assuming creatinine improves, which is likely). Chronic treatment will be a challenge due to ongoing cocaine use and poor compliance. Adding ARB and then aldactone to furosemide would be beneficial to reduce risk of hypokalemia(again, after renal function shows improvement). No beta blocker for now.  Thurmon Fair, MD, [facility] [phone] 10/18/2019, 4:17  PM

## 2019-10-18 NOTE — Progress Notes (Addendum)
PT Cancellation Note  Patient Details Name: Alyssa Barnett MRN: [id number] DOB: 11-22-1975   Cancelled Treatment:    Reason Eval/Treat Not Completed: PT screened, no needs identified, will sign off, patient minimally responded to therapist. Reports that she is up to BR and does not need PT.Marland Kitchen Please reconsult  PT if indicated .   Rada Hay 10/18/2019, 9:12 AM Blanchard Kelch PT Acute Rehabilitation Services Pager [phone] Office [phone]

## 2019-10-19 ENCOUNTER — Inpatient Hospital Stay (HOSPITAL_COMMUNITY): Payer: Medicaid Other

## 2019-10-19 DIAGNOSIS — W19XXXD Unspecified fall, subsequent encounter: Secondary | ICD-10-CM

## 2019-10-19 DIAGNOSIS — W19XXXA Unspecified fall, initial encounter: Secondary | ICD-10-CM

## 2019-10-19 DIAGNOSIS — I501 Left ventricular failure: Secondary | ICD-10-CM

## 2019-10-19 LAB — BASIC METABOLIC PANEL
Anion gap: 7 (ref 5–15)
BUN: 17 mg/dL (ref 6–20)
CO2: 23 mmol/L (ref 22–32)
Calcium: 8 mg/dL — ABNORMAL LOW (ref 8.9–10.3)
Chloride: 98 mmol/L (ref 98–111)
Creatinine, Ser: 1.2 mg/dL — ABNORMAL HIGH (ref 0.44–1.00)
GFR calc Af Amer: >60 mL/min (ref >60)
GFR calc non Af Amer: 56 mL/min — ABNORMAL LOW (ref >60)
Glucose, Bld: 97 mg/dL (ref 70–99)
Potassium: 4.2 mmol/L (ref 3.5–5.1)
Sodium: 128 mmol/L — ABNORMAL LOW (ref 135–145)

## 2019-10-19 LAB — KAPPA/LAMBDA LIGHT CHAINS
Kappa free light chain: 155.8 mg/L — ABNORMAL HIGH (ref 3.3–19.4)
Kappa, lambda light chain ratio: 1.27 (ref 0.26–1.65)
Lambda free light chains: 122.9 mg/L — ABNORMAL HIGH (ref 5.7–26.3)

## 2019-10-19 LAB — HEPATITIS B SURFACE ANTIGEN

## 2019-10-19 MED ORDER — LOSARTAN POTASSIUM 25 MG PO TABS
25.0000 mg | ORAL_TABLET | Freq: Every day | ORAL | Status: DC
  Administered 2019-10-19 – 2019-10-22 (×4): 25 mg via ORAL
  Filled 2019-10-19 (×4): qty 1

## 2019-10-19 NOTE — Progress Notes (Addendum)
This note also relates to the following rows which could not be included: ECG Heart Rate - Cannot attach notes to unvalidated device data Resp - Cannot attach notes to unvalidated device data    10/19/19 0400  What Happened  Was fall witnessed? No  Was patient injured? Yes  Patient found on floor  Found by Staff-comment (Alexis,NT)  Stated prior activity ambulating-unassisted  Follow Up  MD notified Katherina Right  Time MD notified [phone]  Family notified No - patient refusal  Additional tests Yes-comment  Simple treatment Ice  Progress note created (see row info) Yes  Adult Fall Risk Assessment  Risk Factor Category (scoring not indicated) Not Applicable  Patient Fall Risk Level High fall risk  Adult Fall Risk Interventions  Required Bundle Interventions *See Row Information* High fall risk - low, moderate, and high requirements implemented  Additional Interventions Use of appropriate toileting equipment (bedpan, BSC, etc.)  Screening for Fall Injury Risk (To be completed on HIGH fall risk patients) - Assessing Need for Low Bed  Risk For Fall Injury- Low Bed Criteria None identified - Continue screening  Screening for Fall Injury Risk (To be completed on HIGH fall risk patients who do not meet crieteria for Low Bed) - Assessing Need for Floor Mats Only  Risk For Fall Injury- Criteria for Floor Mats None identified - No additional interventions needed

## 2019-10-19 NOTE — Progress Notes (Addendum)
Pt found on floor by Alexis,NT. Pt was ambulating to bathroom and states she doesn't know what happened. Bed was saturated with urine and urine also on floor. Pt was found to have bruising and swelling to left eye. Ice pack given. Pt states that nothing hurts. Pt A/O X 4. Vital signs taken, pt assisted back to bed. Pt face appears to be asymmetrical. ICU nurse called to bedside for assessment. NIH (-). Denny notified. Pt states there is no family to call and notify of fall. New orders received for CT and Xray. Yellow fall risk bracelet and footies placed on pt, fall mat down, bed in lowest position, side rails up X 3 and call bell with in reach. Pt educated on new high risk status.

## 2019-10-19 NOTE — Progress Notes (Addendum)
Progress Note  Patient Name: Alyssa Barnett Date of Encounter: 10/19/2019  Primary Cardiologist: New to [facility]   Subjective   No new complaints today.   Inpatient Medications    Scheduled Meds: . aspirin EC  81 mg Oral Daily  . cloNIDine  0.1 mg Oral QID   Followed by  . cloNIDine  0.1 mg Oral BH-qamhs   Followed by  . [START ON 10/21/2019] cloNIDine  0.1 mg Oral QAC breakfast  . Darunavir-Cobicisctat-Emtricitabine-Tenofovir Alafenamide  1 tablet Oral Q breakfast  . enoxaparin (LOVENOX) injection  40 mg Subcutaneous Q24H  . nicotine  14 mg Transdermal Daily  . sodium chloride flush  3 mL Intravenous Q12H   Continuous Infusions: . sodium chloride     PRN Meds: sodium chloride, dicyclomine, hydrOXYzine, loperamide, methocarbamol, ondansetron, sodium chloride flush, traMADol   Vital Signs    Vitals:   10/19/19 0530 10/19/19 0600 10/19/19 0630 10/19/19 0700  BP: (!) 130/104 121/83 122/89 124/89  Pulse: 81 78 78 80  Resp: (!) 28 (!) 25 (!) 24 (!) 22  Temp:      TempSrc:      SpO2: 97% 96% 94% 92%  Weight:      Height:        Intake/Output Summary (Last 24 hours) at 10/19/2019 0814 Last data filed at 10/18/2019 0944 Gross per 24 hour  Intake 363 ml  Output --  Net 363 ml   Filed Weights   10/17/19 1359 10/18/19 0600  Weight: 53.6 kg 56.7 kg    Physical Exam   General: Appears older then stated age, NAD Neck: Negative for carotid bruits. No JVD Lungs:Clear to ausculation bilaterally. No wheezes, rales, or rhonchi. Breathing is unlabored. Cardiovascular: RRR with S1 S2. No murmurs Extremities: No edema. Radial pulses 2+ bilaterally Neuro: Alert and oriented. No focal deficits. No facial asymmetry. MAE spontaneously. Psych: Responds to questions appropriately with normal affect.    Labs    Chemistry Recent Labs  Lab 10/16/19 1332 10/16/19 1332 10/17/19 0630 10/18/19 0453 10/19/19 0534  NA 128*   < > 131* 131* 128*  K 5.7*   < > 3.7 3.6 4.2   CL 98   < > 103 95* 98  CO2 19*   < > 20* 24 23  GLUCOSE 134*   < > 112* 130* 97  BUN 15   < > 19 19 17   CREATININE 1.08*   < > 1.17* 1.55* 1.20*  CALCIUM 8.5*   < > 7.5* 8.0* 8.0*  PROT 6.8  --   --   --   --   ALBUMIN 2.8*  --   --   --   --   AST 62*  --   --   --   --   ALT 22  --   --   --   --   ALKPHOS 54  --   --   --   --   BILITOT 0.8  --   --   --   --   GFRNONAA >60   < > 57* 41* 56*  GFRAA >60   < > >60 47* >60  ANIONGAP 11   < > 8 12 7    < > = values in this interval not displayed.     Hematology Recent Labs  Lab 10/16/19 1332 10/17/19 0630  WBC 5.3 4.3  RBC 4.83 4.71  HGB 12.6 12.6  HCT 39.4 39.5  MCV 81.6 83.9  MCH  26.1 26.8  MCHC 32.0 31.9  RDW 14.1 13.9  PLT 173 152    Cardiac EnzymesNo results for input(s): TROPONINI in the last 168 hours. No results for input(s): TROPIPOC in the last 168 hours.   BNP Recent Labs  Lab 10/16/19 1332  BNP 1,929.2*     DDimer  Recent Labs  Lab 10/16/19 1358  DDIMER 2.58*     Radiology    DG Chest 2 View  Result Date: 10/19/2019 CLINICAL DATA:  Larey Seat, weakness EXAM: CHEST - 2 VIEW COMPARISON:  10/16/2019 FINDINGS: Frontal and lateral views of the chest demonstrates stable enlargement of the cardiac silhouette. There are bilateral pleural effusions, left greater than right. Persistent areas of bibasilar consolidation are seen, greatest at the left lung base. Decreased central vascular congestion. No pneumothorax. No acute displaced fracture. IMPRESSION: 1. Persistent bilateral pleural effusions and bibasilar atelectasis. Overall improved volume status with decreased central vascular congestion. Electronically Signed   By: Sharlet Salina M.D.   On: 10/19/2019 03:44   CT HEAD WO CONTRAST  Result Date: 10/19/2019 CLINICAL DATA:  Head trauma, HIV EXAM: CT HEAD WITHOUT CONTRAST TECHNIQUE: Contiguous axial images were obtained from the base of the skull through the vertex without intravenous contrast. COMPARISON:   None. FINDINGS: Brain: No acute infarct or hemorrhage. Lateral ventricles and midline structures are unremarkable. No acute extra-axial fluid collections. No mass effect. Vascular: No hyperdense vessel or unexpected calcification. Skull: Normal. Negative for fracture or focal lesion. Sinuses/Orbits: There is significant left periorbital soft tissue swelling. No acute displaced fractures. Other: None. IMPRESSION: 1. Left periorbital soft tissue swelling. 2. No acute intracranial process. Electronically Signed   By: Sharlet Salina M.D.   On: 10/19/2019 03:43   CT ANGIO CHEST PE W OR WO CONTRAST  Result Date: 10/17/2019 CLINICAL DATA:  High probability for pulmonary embolism. History of substance abuse. EXAM: CT ANGIOGRAPHY CHEST WITH CONTRAST TECHNIQUE: Multidetector CT imaging of the chest was performed using the standard protocol during bolus administration of intravenous contrast. Multiplanar CT image reconstructions and MIPs were obtained to evaluate the vascular anatomy. CONTRAST:  33mL OMNIPAQUE IOHEXOL 350 MG/ML SOLN COMPARISON:  None. FINDINGS: Cardiovascular: Enlarged appearance of the heart. No significant pericardial effusion. No pulmonary artery filling defect Mediastinum/Nodes: Negative for adenopathy or mass. Lungs/Pleura: Moderate layering pleural effusions. Streaky opacity in the lower lungs with volume loss more consistent with atelectasis than consolidation. Minimal and nonspecific ground-glass opacity in the subpleural left upper lobe. Mild generalized interlobular septal thickening best seen on coronal reformats Upper Abdomen: Intrahepatic venous reflux. Small volume ascites seen about the liver. Musculoskeletal: No acute or aggressive finding. Degenerative facet and endplate changes seen at T11-12. Review of the MIP images confirms the above findings. IMPRESSION: 1. Cardiomegaly with mild interstitial edema and moderate pleural effusions. Small volume ascites is also seen in the upper abdomen.  2. Pulmonary opacities with morphology suggesting atelectasis. 3. Negative for pulmonary embolism. Electronically Signed   By: Marnee Spring M.D.   On: 10/17/2019 11:22   ECHOCARDIOGRAM COMPLETE  Result Date: 10/17/2019    ECHOCARDIOGRAM REPORT   Patient Name:   Alyssa Barnett Date of Exam: 10/17/2019 Medical Rec #:  [id number]         Height:       64.0 in Accession #:    [id number]        Weight:       125.0 lb Date of Birth:  February 12, 1975         BSA:  1.602 m Patient Age:    44 years          BP:           137/92 mmHg Patient Gender: F                 HR:           73 bpm. Exam Location:  Inpatient Procedure: 2D Echo Indications:    CHF 428  History:        Patient has no prior history of Echocardiogram examinations.                 Risk Factors:Current Smoker. HIV. substance abuse.  Sonographer:    Celene Skeen RDCS (AE) Referring Phys: [id number] JENNIFER YATES  Sonographer Comments: patient had difficulty remaining awake during exam, as a result unable to follow directions at times. IMPRESSIONS  1. Left ventricular ejection fraction, by estimation, is 25 to 30%. The left ventricle has severely decreased function. The left ventricle demonstrates global hypokinesis. The left ventricular internal cavity size was mildly dilated. Left ventricular diastolic parameters are consistent with Grade II diastolic dysfunction (pseudonormalization).  2. Right ventricular systolic function is moderately reduced. The right ventricular size is mildly enlarged. Mildly increased right ventricular wall thickness.  3. Left atrial size was mildly dilated.  4. Right atrial size was mildly dilated.  5. A small pericardial effusion is present.  6. The mitral valve is grossly normal. Mild mitral valve regurgitation. No evidence of mitral stenosis.  7. The aortic valve is grossly normal. Aortic valve regurgitation is not visualized. No aortic stenosis is present. FINDINGS  Left Ventricle: Left ventricular ejection fraction, by  estimation, is 25 to 30%. The left ventricle has severely decreased function. The left ventricle demonstrates global hypokinesis. The left ventricular internal cavity size was mildly dilated. There is no left ventricular hypertrophy. Left ventricular diastolic parameters are consistent with Grade II diastolic dysfunction (pseudonormalization). Right Ventricle: The right ventricular size is mildly enlarged. Mildly increased right ventricular wall thickness. Right ventricular systolic function is moderately reduced. Left Atrium: Left atrial size was mildly dilated. Right Atrium: Right atrial size was mildly dilated. Pericardium: A small pericardial effusion is present. Mitral Valve: The mitral valve is grossly normal. Mild mitral valve regurgitation. No evidence of mitral valve stenosis. Tricuspid Valve: The tricuspid valve is normal in structure. Tricuspid valve regurgitation is mild. Aortic Valve: The aortic valve is grossly normal. Aortic valve regurgitation is not visualized. No aortic stenosis is present. Pulmonic Valve: The pulmonic valve was grossly normal. Pulmonic valve regurgitation is not visualized. No evidence of pulmonic stenosis. Aorta: The aortic root and ascending aorta are structurally normal, with no evidence of dilitation. IAS/Shunts: The atrial septum is grossly normal.  LEFT VENTRICLE PLAX 2D LVIDd:         5.40 cm  Diastology LVIDs:         4.50 cm  LV e' medial:    4.90 cm/s LV PW:         1.30 cm  LV E/e' medial:  19.0 LV IVS:        1.00 cm  LV e' lateral:   9.90 cm/s LVOT diam:     1.80 cm  LV E/e' lateral: 9.4 LV SV:         42 LV SV Index:   26 LVOT Area:     2.54 cm  RIGHT VENTRICLE TAPSE (M-mode): 1.4 cm LEFT ATRIUM  Index LA diam:        2.70 cm 1.69 cm/m LA Vol (A2C):   63.4 ml 39.57 ml/m LA Vol (A4C):   59.6 ml 37.20 ml/m LA Biplane Vol: 61.1 ml 38.14 ml/m  AORTIC VALVE LVOT Vmax:   104.00 cm/s LVOT Vmean:  70.600 cm/s LVOT VTI:    0.166 m  AORTA Ao Root diam: 2.80 cm  MITRAL VALVE MV Area (PHT): 4.60 cm    SHUNTS MV Decel Time: 165 msec    Systemic VTI:  0.17 m MV E velocity: 93.00 cm/s  Systemic Diam: 1.80 cm Kristeen Miss MD Electronically signed by Kristeen Miss MD Signature Date/Time: 10/17/2019/4:17:43 PM    Final    Telemetry    10/19/19 NSR- Personally Reviewed  ECG    No new tracing as of 10/19/19- Personally Reviewed  Cardiac Studies   Echocardiogram 10/17/19:  1. Left ventricular ejection fraction, by estimation, is 25 to 30%. The  left ventricle has severely decreased function. The left ventricle  demonstrates global hypokinesis. The left ventricular internal cavity size  was mildly dilated. Left ventricular  diastolic parameters are consistent with Grade II diastolic dysfunction  (pseudonormalization).  2. Right ventricular systolic function is moderately reduced. The right  ventricular size is mildly enlarged. Mildly increased right ventricular  wall thickness.  3. Left atrial size was mildly dilated.  4. Right atrial size was mildly dilated.  5. A small pericardial effusion is present.  6. The mitral valve is grossly normal. Mild mitral valve regurgitation.  No evidence of mitral stenosis.  7. The aortic valve is grossly normal. Aortic valve regurgitation is not  visualized. No aortic stenosis is present.   Patient Profile     44 y.o. female with a hx of untreated HIV and polysubstance abuse who is being seen today for the evaluation of chest pain and new systolic CHF at the request of Dr. Jarvis Newcomer.  Assessment & Plan    1.  New acute combined systolic and diastolic CHF/cardiomyopathy: -Echocardiogram showed an LVEF at 25 to 30% with global hypokinesis, grade 2 DD, moderate RV failure, mildly dilated LA/RA, small pericardial effusion, mild MR and no AS.  -LDL, 59 -Given renal dysfunction, not a candidate for ACEI/ARB/ARNI at this time however once renal function improved, will consider therapies for cardiomyopathy -Likely  new systolic dysfunction in the setting of polysubstance/cocaine abuse however cannot fully rule out coronary involvement.  -Not currently a candidate for invasive assessment given poor respiratory status. Will need to consider compliance prior to invasive workup as well as she has a known hx of poor medication and follow up.   2.  Untreated HIV versus ARDS with possible acute PNA: -Per chart review, patient [facility] at [facility].  Last seen 07/2019 -Currently being treated with Rocephin and azithromycin with plans to follow viral load, CD4 and other lab work -ID has been consulted per primary team -Management per ID/IM  3.  Polysubstance abuse: -UDS positive for cocaine, THC -History of leaving AMA -Poor compliance  4.  Tobacco use: -Cessation strongly encouraged  5.  AKI: -Initial creatinine on 10/16/2019 at 1.08 with an increased to 1.55>> improving today at 1.20 -Follow closely   Signed, Georgie Chard NP-C [facility] Pager: [phone] 10/19/2019, 8:14 AM     For questions or updates, please contact   Please consult [email or web address] for contact info under Cardiology/STEMI.  I have seen and examined the patient along with Georgie Chard NP-C.  I have reviewed the chart, notes and new  data.  I agree with PA/NP's note.  Key new complaints: not particularly communicative. No CV complaints. It seems she fell last night, but she does not recall what happened.  Key examination changes: no overt signs of hypervolemia on exam Key new findings / data: normal BP, improved renal parameters, persistent hyponatremia  PLAN: No overt volume overload. Transient worsening of renal function yesterday seemed to indicate a little overdiuresis. 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 She may therefore be very volume sensitive. Amyloidosis labs pending (although unlikely diagnosis at her age). 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 Will  try to add low dose ARB since BP/renal function appears to allow that today.  Thurmon Fair, MD, [facility] [phone] 10/19/2019, 11:21 AM

## 2019-10-19 NOTE — Progress Notes (Addendum)
INFECTIOUS DISEASE PROGRESS NOTE  ID: Alyssa Barnett is a 44 y.o. 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   Abtx:  Anti-infectives (From admission, onward)   Start     Dose/Rate Route Frequency Ordered Stop   10/18/19 0000  Darunavir-Cobicisctat-Emtricitabine-Tenofovir Alafenamide (SYMTUZA) 800-150-200-10 MG TABS       Note to Pharmacy: 1 time fill please. PT assistance: [id number] Group: [id number] ID: [id number]   1 tablet Oral Daily with breakfast 10/18/19 0839     10/17/19 1000  Darunavir-Cobicisctat-Emtricitabine-Tenofovir Alafenamide (SYMTUZA) 800-150-200-10 MG TABS 1 tablet        1 tablet Oral Daily with breakfast 10/17/19 0909     10/16/19 1330  cefTRIAXone (ROCEPHIN) 2 g in sodium chloride 0.9 % 100 mL IVPB  Status:  Discontinued        2 g 200 mL/hr over 30 Minutes Intravenous Every 24 hours 10/16/19 1325 10/17/19 1430   10/16/19 1330  azithromycin (ZITHROMAX) 500 mg in sodium chloride 0.9 % 250 mL IVPB  Status:  Discontinued        500 mg 250 mL/hr over 60 Minutes Intravenous Every 24 hours 10/16/19 1325 10/17/19 1430      Medications:  Scheduled: . aspirin EC  81 mg Oral Daily  . cloNIDine  0.1 mg Oral BH-qamhs   Followed by  . [START ON 10/21/2019] cloNIDine  0.1 mg Oral QAC breakfast  . Darunavir-Cobicisctat-Emtricitabine-Tenofovir Alafenamide  1 tablet Oral Q breakfast  . enoxaparin (LOVENOX) injection  40 mg Subcutaneous Q24H  . losartan  25 mg Oral Daily  . nicotine  14 mg Transdermal Daily  . sodium chloride flush  3 mL Intravenous Q12H    Objective: Vital signs in last 24 hours: Temp:  [97.9 F (36.6 C)-99 F (37.2 C)] 99 F (37.2 C) (09/17 0350) Pulse Rate:  [78-92] 83 (09/17 1300) Resp:  [17-28] 24 (09/17  1300) BP: (108-141)/(83-104) 123/93 (09/17 1300) SpO2:  [92 %-100 %] 97 % (09/17 1300)   General appearance: alert and no distress Eyes: negative findings: EOMI, positive findings: eyelids/periorbital: periorbital edema echymosis Resp: clear to auscultation bilaterally Cardio: regular rate and rhythm GI: normal findings: bowel sounds normal and soft, non-tender  Lab Results Recent Labs    10/17/19 0630 10/17/19 0630 10/18/19 0453 10/19/19 0534  WBC 4.3  --   --   --   HGB 12.6  --   --   --   HCT 39.5  --   --   --   NA 131*   < > 131* 128*  K 3.7   < > 3.6 4.2  CL 103   < > 95* 98  CO2 20*   < > 24 23  BUN 19   < > 19 17  CREATININE 1.17*   < > 1.55* 1.20*   < > = values in this interval not displayed.   Liver Panel No results for input(s): PROT, ALBUMIN, AST, ALT, ALKPHOS, BILITOT, BILIDIR, IBILI in the last 72 hours. Sedimentation Rate No results for input(s): ESRSEDRATE in the last 72 hours. C-Reactive Protein No results for input(s): CRP in the last 72 hours.  Microbiology: Recent Results (from the past 240 hour(s))  SARS Coronavirus 2 by RT PCR (hospital order, performed in [facility] hospital lab) Nasopharyngeal  Nasopharyngeal Swab     Status: None   Collection Time: 10/16/19  1:32 PM   Specimen: Nasopharyngeal Swab  Result Value Ref Range Status   SARS Coronavirus 2 NEGATIVE NEGATIVE Final    Comment: (NOTE) SARS-CoV-2 target nucleic acids are NOT DETECTED.  The SARS-CoV-2 RNA is generally detectable in upper and lower respiratory specimens during the acute phase of infection. The lowest concentration of SARS-CoV-2 viral copies this assay can detect is 250 copies / mL. A negative result does not preclude SARS-CoV-2 infection and should not be used as the sole basis for treatment or other patient management decisions.  A negative result may occur with improper specimen collection / handling, submission of specimen other than nasopharyngeal swab, presence  of viral mutation(s) within the areas targeted by this assay, and inadequate number of viral copies (<250 copies / mL). A negative result must be combined with clinical observations, patient history, and epidemiological information.  Fact Sheet for Patients:   BoilerBrush.com.cy  Fact Sheet for Healthcare Providers: https://pope.com/  This test is not yet approved or  cleared by the [place] FDA and has been authorized for detection and/or diagnosis of SARS-CoV-2 by FDA under an Emergency Use Authorization (EUA).  This EUA will remain in effect (meaning this test can be used) for the duration of the COVID-19 declaration under Section 564(b)(1) of the Act, 21 U.S.C. section 360bbb-3(b)(1), unless the authorization is terminated or revoked sooner.  Performed at [facility], [address], [place], [place] [address]   Blood Culture (routine x 2)     Status: None (Preliminary result)   Collection Time: 10/16/19  1:58 PM   Specimen: BLOOD  Result Value Ref Range Status   Specimen Description   Final    BLOOD RIGHT UPPER ARM Performed at [facility], [address], [place], [place] [address]    Special Requests   Final    BOTTLES DRAWN AEROBIC AND ANAEROBIC Blood Culture results may not be optimal due to an excessive volume of blood received in culture bottles Performed at [facility], [address], [place], [place] [address]    Culture   Final    NO GROWTH 3 DAYS Performed at [facility], [address] [place], [place] [address]    Report Status PENDING  Incomplete  Blood Culture (routine x 2)     Status: None (Preliminary result)   Collection Time: 10/16/19  1:58 PM   Specimen: BLOOD  Result Value Ref Range Status   Specimen Description   Final    BLOOD RIGHT ANTECUBITAL Performed at [facility], [address] [place], [place]  [address]    Special Requests   Final    BOTTLES DRAWN AEROBIC AND ANAEROBIC Blood Culture adequate volume Performed at [facility], [address], [place], [place] [address]    Culture   Final    NO GROWTH 3 DAYS Performed at [facility], [address], [place], [place] [address]    Report Status PENDING  Incomplete    Studies/Results: DG Chest 2 View  Result Date: 10/19/2019 CLINICAL DATA:  Larey Seat, weakness EXAM: CHEST - 2 VIEW COMPARISON:  10/16/2019 FINDINGS: Frontal and lateral views of the chest demonstrates stable enlargement of the cardiac silhouette. There are bilateral pleural effusions, left greater than right. Persistent areas of bibasilar consolidation are seen, greatest at the left lung base. Decreased central vascular congestion. No pneumothorax. No acute  displaced fracture. IMPRESSION: 1. Persistent bilateral pleural effusions and bibasilar atelectasis. Overall improved volume status with decreased central vascular congestion. Electronically Signed   By: Sharlet Salina M.D.   On: 10/19/2019 03:44   CT HEAD WO CONTRAST  Result Date: 10/19/2019 CLINICAL DATA:  Head trauma, HIV EXAM: CT HEAD WITHOUT CONTRAST TECHNIQUE: Contiguous axial images were obtained from the base of the skull through the vertex without intravenous contrast. COMPARISON:  None. FINDINGS: Brain: No acute infarct or hemorrhage. Lateral ventricles and midline structures are unremarkable. No acute extra-axial fluid collections. No mass effect. Vascular: No hyperdense vessel or unexpected calcification. Skull: Normal. Negative for fracture or focal lesion. Sinuses/Orbits: There is significant left periorbital soft tissue swelling. No acute displaced fractures. Other: None. IMPRESSION: 1. Left periorbital soft tissue swelling. 2. No acute intracranial process. Electronically Signed   By: Sharlet Salina M.D.   On: 10/19/2019 03:43     Assessment/Plan: HIV+ HB S Ag+ Cocaine abuse LVEF  25-30% Chest Pain  Fall with injury L eye  Total days of antibiotics: off  BCx 9-14 ngtd.  Check Hep B DNA.  Would expect higher LFTs if there were hepatitis flare with withdrawl/non-adherence of HIV tx.   appreciate CV f/u  Continue symtuza, recheck HIV RNA prior to d/c.         Johny Sax MD, FACP Infectious Diseases (pager) [phone] [email or web address] 10/19/2019, 2:28 PM  LOS: 3 days

## 2019-10-19 NOTE — Progress Notes (Addendum)
PROGRESS NOTE  Alyssa Barnett  GNF:[id number] DOB: [birth year] DOA: 10/16/2019 PCP: Patient, No Pcp Per   Brief Narrative: Alyssa Barnett is a 44 y.o. female with medical history significant of untreated HIV presenting with SOB.  She was seen in the ER on 8/31 with chest discomfort.  CXR with ?atypical infection, decreased O2 sat and started on [place] O2.  Elevated D-dimer, unable to tolerate CT.  Left AMA.  She returns with c/o pleuritic chest pain.  At the time of my evaluation, she was lying curled up and refused to answer questions other than to say "pain" repeatedly and intermittently say that her chest hurt.  She periodically screams out for the nurse due to pain. UDS +cocaine, THC. WBC wnl, afebrile. Lasix and antibiotics were started with ID consultation and the patient was admitted with CTA chest pending. This revealed cardiomegaly but no PE and no significant pulmonary infiltrate/pneumonia. Antibiotics are stopped and echocardiogram is pending.  Assessment & Plan: Principal Problem:   Acquired immunodeficiency syndrome (AIDS) with bacterial pneumonia (HCC) Active Problems:   New onset of congestive heart failure (HCC)   Polysubstance abuse (HCC)   Tobacco dependence  HIV disease: Untreated.  - Restarted ART per ID - Viral load pending. CD4 count is 476. Will need continued follow up.  - Quantiferon gold pending, RPR NR.  Acute combined CHF: Suspected to be cause of opacities on radiologic evaluation with cardiomegaly, elevated BNP, interstitial edema. Echo showed EF 25-30%, global hypokinesis, G2DD, moderately reduced RV systolic function. 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 Infiltrative process is possible  - s/p IV lasix, holding with Cr rise and currently improved volume status.  - Monitor I/O, daily weights. UOP not documented due to incontinence, weight today pending (between 118 -  125lbs thus far) - Avoiding BB for now w/+cocaine.  - Starting low dose ARB, will monitor BP and Cr and K in AM.  - Follow up amyloid labs (pending).   Fall with head trauma: No acute intracranial or skeletal abnormality on CT head overnight. No focal deficits on exam (though limited by cooperation).  - Supportive care, fall precautions. No red flags on eye exam at this time.  AKI:  - Improving after NSAID, lasix held.   Positive hepatitis B surface antigen: Negative core IgM. No specifically signs or symptoms to suggest acute infection. Could be false positive.   - Send hepatitis B DNA  Pneumonia ruled out: No consolidation on CXR, PCT and CRP and WBC negative, afebrile.  - DC'ed abx  Troponin elevation: In setting of suspected CHF and cocaine use. Mildly elevated without overtly ischemic ECG changes.  - Avoid cocaine - Cardiology consulted as above. Not currently planning ischemic evaluation.  Polysubstance abuse - Continue COWS protocol and clonidine.  - Narcotics not currently indicated for treatment of pain as above.  Tobacco dependence - Continue cessation counseling - Nicotine patch if needed.   DVT prophylaxis: Lovenox 40mg  daily Code Status: Full Family Communication: None at bedside Disposition Plan:  Status is: Inpatient  Remains inpatient appropriate because:Ongoing diagnostic testing needed not appropriate for outpatient work up  Dispo: The patient is from: [facility]              Anticipated d/c is to: [facility]              Anticipated d/c date is: 1 day pending clearance by ID, cardiology.  Patient currently is not medically stable to d/c.  Consultants:   ID  Cardiology  Procedures:   Echocardiogram 10/17/2019 1. Left ventricular ejection fraction, by estimation, is 25 to 30%. The  left ventricle has severely decreased function. The left ventricle  demonstrates global hypokinesis. The left ventricular internal cavity size  was mildly dilated.  Left ventricular  diastolic parameters are consistent with Grade II diastolic dysfunction  (pseudonormalization).  2. Right ventricular systolic function is moderately reduced. The right  ventricular size is mildly enlarged. Mildly increased right ventricular  wall thickness.  3. Left atrial size was mildly dilated.  4. Right atrial size was mildly dilated.  5. A small pericardial effusion is present.  6. The mitral valve is grossly normal. Mild mitral valve regurgitation.  No evidence of mitral stenosis.  7. The aortic valve is grossly normal. Aortic valve regurgitation is not  visualized. No aortic stenosis is present.   Antimicrobials:  Ceftriaxone, azithromycin  ART   Subjective: Larey Seat last night with unclear circumstances. She dreamt she was urinating, and remembers wanting to get to the bathroom but nothing else. Has mild headache currently, but able to sleep which helps. No complaints. Denies current dizziness, eye pain, photophobia, altered vision.  Objective: Vitals:   10/19/19 0630 10/19/19 0700 10/19/19 0900 10/19/19 1100  BP: 122/89 124/89  (!) 128/93  Pulse: 78 80 80 88  Resp: (!) 24 (!) 22  19  Temp:      TempSrc:      SpO2: 94% 92% 93% 96%  Weight:      Height:       No intake or output data in the 24 hours ending 10/19/19 1311 Filed Weights   10/17/19 1359 10/18/19 0600  Weight: 53.6 kg 56.7 kg   Gen: 44 y.o. female in no distress Eyes: Left periorbital ecchymosis with some dependent change visible. Able to open eye, but reluctant to do so, though denies photophobia. Visual acuity preserved per pt report. PERRL  Pulm: Nonlabored breathing room air. Clear CV: Regular rate and rhythm. No murmur, rub, or gallop. No JVD, no dependent edema. GI: Abdomen soft, non-tender, non-distended, with normoactive bowel sounds.  Ext: Warm, no deformities Skin: No new rashes, lesions or ulcers on visualized skin aside from ecchymosis as above. Neuro: Alert and  oriented. No focal neurological deficits, though not cooperative with full exam. Psych: Judgement and insight appear fair.   Data Reviewed: I have personally reviewed following labs and imaging studies  CBC: Recent Labs  Lab 10/16/19 1332 10/17/19 0630  WBC 5.3 4.3  NEUTROABS  --  2.5  HGB 12.6 12.6  HCT 39.4 39.5  MCV 81.6 83.9  PLT 173 152   Basic Metabolic Panel: Recent Labs  Lab 10/16/19 1332 10/17/19 0630 10/18/19 0453 10/19/19 0534  NA 128* 131* 131* 128*  K 5.7* 3.7 3.6 4.2  CL 98 103 95* 98  CO2 19* 20* 24 23  GLUCOSE 134* 112* 130* 97  BUN 15 19 19 17   CREATININE 1.08* 1.17* 1.55* 1.20*  CALCIUM 8.5* 7.5* 8.0* 8.0*   GFR: Estimated Creatinine Clearance: 52.7 mL/min (A) (by C-G formula based on SCr of 1.2 mg/dL (H)). Liver Function Tests: Recent Labs  Lab 10/16/19 1332  AST 62*  ALT 22  ALKPHOS 54  BILITOT 0.8  PROT 6.8  ALBUMIN 2.8*   Recent Labs  Lab 10/16/19 1332  LIPASE 26   No results for input(s): AMMONIA in the last 168 hours. Coagulation Profile: No results for  input(s): INR, PROTIME in the last 168 hours. Cardiac Enzymes: No results for input(s): CKTOTAL, CKMB, CKMBINDEX, TROPONINI in the last 168 hours. BNP (last 3 results) No results for input(s): PROBNP in the last 8760 hours. HbA1C: No results for input(s): HGBA1C in the last 72 hours. CBG: No results for input(s): GLUCAP in the last 168 hours. Lipid Profile: Recent Labs    10/16/19 1358 10/17/19 0630  CHOL  --  99  HDL  --  20*  LDLCALC  --  59  TRIG 103 100  CHOLHDL  --  5.0   Thyroid Function Tests: No results for input(s): TSH, T4TOTAL, FREET4, T3FREE, THYROIDAB in the last 72 hours. Anemia Panel: Recent Labs    10/16/19 1358  FERRITIN 25   Urine analysis:    Component Value Date/Time   COLORURINE STRAW (A) 10/16/2019 1728   APPEARANCEUR CLEAR 10/16/2019 1728   LABSPEC 1.009 10/16/2019 1728   PHURINE 6.0 10/16/2019 1728   GLUCOSEU NEGATIVE 10/16/2019 1728    HGBUR SMALL (A) 10/16/2019 1728   BILIRUBINUR NEGATIVE 10/16/2019 1728   KETONESUR NEGATIVE 10/16/2019 1728   PROTEINUR 100 (A) 10/16/2019 1728   NITRITE NEGATIVE 10/16/2019 1728   LEUKOCYTESUR NEGATIVE 10/16/2019 1728   Recent Results (from the past 240 hour(s))  SARS Coronavirus 2 by RT PCR (hospital order, performed in [facility] hospital lab) Nasopharyngeal Nasopharyngeal Swab     Status: None   Collection Time: 10/16/19  1:32 PM   Specimen: Nasopharyngeal Swab  Result Value Ref Range Status   SARS Coronavirus 2 NEGATIVE NEGATIVE Final    Comment: (NOTE) SARS-CoV-2 target nucleic acids are NOT DETECTED.  The SARS-CoV-2 RNA is generally detectable in upper and lower respiratory specimens during the acute phase of infection. The lowest concentration of SARS-CoV-2 viral copies this assay can detect is 250 copies / mL. A negative result does not preclude SARS-CoV-2 infection and should not be used as the sole basis for treatment or other patient management decisions.  A negative result may occur with improper specimen collection / handling, submission of specimen other than nasopharyngeal swab, presence of viral mutation(s) within the areas targeted by this assay, and inadequate number of viral copies (<250 copies / mL). A negative result must be combined with clinical observations, patient history, and epidemiological information.  Fact Sheet for Patients:   BoilerBrush.com.cy  Fact Sheet for Healthcare Providers: https://pope.com/  This test is not yet approved or  cleared by the [place] FDA and has been authorized for detection and/or diagnosis of SARS-CoV-2 by FDA under an Emergency Use Authorization (EUA).  This EUA will remain in effect (meaning this test can be used) for the duration of the COVID-19 declaration under Section 564(b)(1) of the Act, 21 U.S.C. section 360bbb-3(b)(1), unless the authorization is  terminated or revoked sooner.  Performed at [facility], [address], [place], [place] [address]   Blood Culture (routine x 2)     Status: None (Preliminary result)   Collection Time: 10/16/19  1:58 PM   Specimen: BLOOD  Result Value Ref Range Status   Specimen Description   Final    BLOOD RIGHT UPPER ARM Performed at [facility], [address], [place], [place] [address]    Special Requests   Final    BOTTLES DRAWN AEROBIC AND ANAEROBIC Blood Culture results may not be optimal due to an excessive volume of blood received in culture bottles Performed at [facility], [address] [place],  [place] [address]    Culture   Final    NO GROWTH 3 DAYS Performed at [facility], [address], [place], [place] [address]    Report Status PENDING  Incomplete  Blood Culture (routine x 2)     Status: None (Preliminary result)   Collection Time: 10/16/19  1:58 PM   Specimen: BLOOD  Result Value Ref Range Status   Specimen Description   Final    BLOOD RIGHT ANTECUBITAL Performed at [facility], [address], [place], [place] [address]    Special Requests   Final    BOTTLES DRAWN AEROBIC AND ANAEROBIC Blood Culture adequate volume Performed at [facility] [address] [place], [place] [address]    Culture   Final    NO GROWTH 3 DAYS Performed at [facility], [address] [place], [place] [address]    Report Status PENDING  Incomplete      Radiology Studies: DG Chest 2 View  Result Date: 10/19/2019 CLINICAL DATA:  Larey Seat, weakness EXAM: CHEST - 2 VIEW COMPARISON:  10/16/2019 FINDINGS: Frontal and lateral views of the chest demonstrates stable enlargement of the cardiac silhouette. There are bilateral pleural effusions, left greater than right. Persistent areas of bibasilar consolidation are seen, greatest at the left lung base. Decreased central vascular  congestion. No pneumothorax. No acute displaced fracture. IMPRESSION: 1. Persistent bilateral pleural effusions and bibasilar atelectasis. Overall improved volume status with decreased central vascular congestion. Electronically Signed   By: Sharlet Salina M.D.   On: 10/19/2019 03:44   CT HEAD WO CONTRAST  Result Date: 10/19/2019 CLINICAL DATA:  Head trauma, HIV EXAM: CT HEAD WITHOUT CONTRAST TECHNIQUE: Contiguous axial images were obtained from the base of the skull through the vertex without intravenous contrast. COMPARISON:  None. FINDINGS: Brain: No acute infarct or hemorrhage. Lateral ventricles and midline structures are unremarkable. No acute extra-axial fluid collections. No mass effect. Vascular: No hyperdense vessel or unexpected calcification. Skull: Normal. Negative for fracture or focal lesion. Sinuses/Orbits: There is significant left periorbital soft tissue swelling. No acute displaced fractures. Other: None. IMPRESSION: 1. Left periorbital soft tissue swelling. 2. No acute intracranial process. Electronically Signed   By: Sharlet Salina M.D.   On: 10/19/2019 03:43    Scheduled Meds: . aspirin EC  81 mg Oral Daily  . cloNIDine  0.1 mg Oral BH-qamhs   Followed by  . [START ON 10/21/2019] cloNIDine  0.1 mg Oral QAC breakfast  . Darunavir-Cobicisctat-Emtricitabine-Tenofovir Alafenamide  1 tablet Oral Q breakfast  . enoxaparin (LOVENOX) injection  40 mg Subcutaneous Q24H  . losartan  25 mg Oral Daily  . nicotine  14 mg Transdermal Daily  . sodium chloride flush  3 mL Intravenous Q12H   Continuous Infusions: . sodium chloride       LOS: 3 days   Time spent: 25 minutes.  Tyrone Nine, MD [facility] [email or web address] 10/19/2019, 1:11 PM

## 2019-10-20 DIAGNOSIS — W19XXXA Unspecified fall, initial encounter: Secondary | ICD-10-CM

## 2019-10-20 LAB — HEPATIC FUNCTION PANEL
ALT: 19 U/L (ref 0–44)
AST: 29 U/L (ref 15–41)
Albumin: 2.8 g/dL — ABNORMAL LOW (ref 3.5–5.0)
Alkaline Phosphatase: 58 U/L (ref 38–126)
Bilirubin, Direct: 0.1 mg/dL (ref 0.0–0.2)
Indirect Bilirubin: 0.3 mg/dL (ref 0.3–0.9)
Total Bilirubin: 0.4 mg/dL (ref 0.3–1.2)
Total Protein: 7.2 g/dL (ref 6.5–8.1)

## 2019-10-20 LAB — HEPATITIS B DNA, ULTRAQUANTITATIVE, PCR
HBV DNA SERPL PCR-ACNC: 3130000 IU/mL
HBV DNA SERPL PCR-LOG IU: 6.496 log10 IU/mL

## 2019-10-20 LAB — BASIC METABOLIC PANEL
Anion gap: 9 (ref 5–15)
BUN: 14 mg/dL (ref 6–20)
CO2: 23 mmol/L (ref 22–32)
Calcium: 8.5 mg/dL — ABNORMAL LOW (ref 8.9–10.3)
Chloride: 103 mmol/L (ref 98–111)
Creatinine, Ser: 0.99 mg/dL (ref 0.44–1.00)
GFR calc Af Amer: >60 mL/min (ref >60)
GFR calc non Af Amer: >60 mL/min (ref >60)
Glucose, Bld: 97 mg/dL (ref 70–99)
Potassium: 4.5 mmol/L (ref 3.5–5.1)
Sodium: 135 mmol/L (ref 135–145)

## 2019-10-20 MED ORDER — TRAZODONE HCL 50 MG PO TABS
50.0000 mg | ORAL_TABLET | Freq: Every evening | ORAL | Status: DC | PRN
  Administered 2019-10-20 – 2019-10-21 (×2): 50 mg via ORAL
  Filled 2019-10-20 (×2): qty 1

## 2019-10-20 NOTE — Progress Notes (Addendum)
PROGRESS NOTE  [facility] OMV:[id number] DOB: [birth year] DOA: 10/16/2019 PCP: Patient, No Pcp Per   LOS: 4 days   Brief narrative: As per HPI,  Alyssa Barnett a 44 y.o.femalewith medical history significant for HIV AIDS polysubstance abuse presented to the hospital with shortness of breath chest discomfort and hypoxia.  She had initially came to the ED hypoxic with elevated D-dimer and was unable to tolerate CT scan so she left AMA. She then presented with pleuritic chest pain.  Urine drug screen was positive for cocaine.  Patient was started on Lasix and antibiotics in the ED.  CT scan did not show any evidence of PE but patient was admitted to hospital for further evaluation and treatment.  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  Patient was seen by ID during hospitalization and has been started on antiretroviral therapy.  CD4 count of 476.  HIV RNA pending.  Hepatitis B DNA pending.  QuantiFERON gold noted.  New acute combined CHF: With elevated BNP and interstitial edema.  2D echocardiogram showed ejection fraction of 25 to 30% with global hypokinesis, moderately reduced RV systolic function suggestive of biventricular failure.  Cardiology on board.  Conservative cocaine induced cardiomyopathy.  HIV and infiltrative disease also possible.  Patient initially received Lasix on hold due to elevated creatinine.  Continue daily intake and output charting.  Daily weights.  Avoiding beta-blockers due to cocaine on board.  Low-dose ARB has been initiated.  Follow-up amyloid labs..  Pneumonia has been ruled out.  Off antibiotic.  Fall with head trauma: CT head scan was negative.  Periorbital bruise noted.  AKI:  Has  improved at this time.  Continue to hold NSAIDs and Lasix at this time.  Questionable positive hepatitis B surface antigen: On one test.  Other tests have been negative.  Negative core IgM.  Hepatitis B DNA has been sent.  Troponin elevation: Likely decompensated congestive heart failure and cocaine abuse.  Cardiology not planning for ischemic evaluation.   Polysubstance abuse with anxiety Including marijuana and cocaine abuse.  Concern for opiate abuse, counseling done.  On clonidine, Atarax  Tobacco dependence -Continue nicotine patch.  Counseling done.   DVT prophylaxis: enoxaparin (LOVENOX) injection 40 mg Start: 10/17/19 2200   Code Status: Full code  Family Communication: None.  No contact listed.  Status is: Inpatient  Remains inpatient appropriate because:Inpatient level of care appropriate due to severity of illness, cardiology titrating medication, chance of recurrent admission to the hospital due to polysubstance abuse, unfavorable living situation.  Currently lives in a motel.   Dispo: The patient is from: Home              Anticipated d/c is to: Home              Anticipated d/c date is: 2 days              Patient currently is not medically stable to d/c.  Consultants:  Cardiology  Infectious disease  Procedures:  2D echocardiogram  Antibiotics:  . None  Anti-infectives (From admission, onward)   Start     Dose/Rate Route Frequency Ordered Stop   10/18/19 0000  Darunavir-Cobicisctat-Emtricitabine-Tenofovir Alafenamide (SYMTUZA) 800-150-200-10 MG TABS  Note to Pharmacy: 1 time fill please. PT assistance: [id number] Group: [id number] ID: [id number]   1 tablet Oral Daily with breakfast 10/18/19 0839     10/17/19 1000  Darunavir-Cobicisctat-Emtricitabine-Tenofovir Alafenamide (SYMTUZA) 800-150-200-10 MG TABS 1 tablet        1 tablet Oral Daily with breakfast 10/17/19 0909     10/16/19 1330  cefTRIAXone (ROCEPHIN) 2 g in sodium chloride 0.9 % 100  mL IVPB  Status:  Discontinued        2 g 200 mL/hr over 30 Minutes Intravenous Every 24 hours 10/16/19 1325 10/17/19 1430   10/16/19 1330  azithromycin (ZITHROMAX) 500 mg in sodium chloride 0.9 % 250 mL IVPB  Status:  Discontinued        500 mg 250 mL/hr over 60 Minutes Intravenous Every 24 hours 10/16/19 1325 10/17/19 1430     Subjective: Today, patient was seen and examined at bedside.  Complains of feeling anxiety weakness and unable to sleep in the nighttime.  Objective: Vitals:   10/20/19 0242 10/20/19 0550  BP: (!) 125/95 (!) 109/92  Pulse: 86 89  Resp:    Temp:  97.6 F (36.4 C)  SpO2: 99% 95%    Intake/Output Summary (Last 24 hours) at 10/20/2019 1313 Last data filed at 10/20/2019 1030 Gross per 24 hour  Intake 1080 ml  Output 1 ml  Net 1079 ml   Filed Weights   10/17/19 1359 10/18/19 0600 10/20/19 0550  Weight: 53.6 kg 56.7 kg 52.9 kg   Body mass index is 20.02 kg/m.   Physical Exam: GENERAL: Patient is alert awake and oriented.  Anxious not in obvious distress. HENT: No scleral pallor or icterus. Pupils equally reactive to light. Oral mucosa is moist.  Left periorbital ecchymosis.  Ocular movements are intact.  Vision intact. NECK: is supple, no gross swelling noted. CHEST: Clear to auscultation. No crackles or wheezes.  Diminished breath sounds bilaterally. CVS: S1 and S2 heard, no murmur. Regular rate and rhythm.  ABDOMEN: Soft, non-tender, bowel sounds are present. EXTREMITIES: No edema. CNS: Cranial nerves are intact. No focal motor deficits. SKIN: warm and dry without rashes.  Data Review: I have personally reviewed the following laboratory data and studies,  CBC: Recent Labs  Lab 10/16/19 1332 10/17/19 0630  WBC 5.3 4.3  NEUTROABS  --  2.5  HGB 12.6 12.6  HCT 39.4 39.5  MCV 81.6 83.9  PLT 173 152   Basic Metabolic Panel: Recent Labs  Lab 10/16/19 1332 10/17/19 0630 10/18/19 0453 10/19/19 0534 10/20/19 0605  NA 128* 131* 131* 128* 135   K 5.7* 3.7 3.6 4.2 4.5  CL 98 103 95* 98 103  CO2 19* 20* 24 23 23   GLUCOSE 134* 112* 130* 97 97  BUN 15 19 19 17 14   CREATININE 1.08* 1.17* 1.55* 1.20* 0.99  CALCIUM 8.5* 7.5* 8.0* 8.0* 8.5*   Liver Function Tests: Recent Labs  Lab 10/16/19 1332 10/20/19 0605  AST 62* 29  ALT 22 19  ALKPHOS 54 58  BILITOT 0.8 0.4  PROT 6.8 7.2  ALBUMIN 2.8* 2.8*   Recent Labs  Lab 10/16/19 1332  LIPASE 26   No results for input(s): AMMONIA in the last 168 hours. Cardiac Enzymes: No results for input(s): CKTOTAL, CKMB, CKMBINDEX, TROPONINI in the last 168 hours. BNP (last 3 results) Recent Labs    10/16/19 1332  BNP 1,929.2*    ProBNP (last 3 results) No results for input(s): PROBNP in the last 8760 hours.  CBG: No results  for input(s): GLUCAP in the last 168 hours. Recent Results (from the past 240 hour(s))  SARS Coronavirus 2 by RT PCR (hospital order, performed in [facility] hospital lab) Nasopharyngeal Nasopharyngeal Swab     Status: None   Collection Time: 10/16/19  1:32 PM   Specimen: Nasopharyngeal Swab  Result Value Ref Range Status   SARS Coronavirus 2 NEGATIVE NEGATIVE Final    Comment: (NOTE) SARS-CoV-2 target nucleic acids are NOT DETECTED.  The SARS-CoV-2 RNA is generally detectable in upper and lower respiratory specimens during the acute phase of infection. The lowest concentration of SARS-CoV-2 viral copies this assay can detect is 250 copies / mL. A negative result does not preclude SARS-CoV-2 infection and should not be used as the sole basis for treatment or other patient management decisions.  A negative result may occur with improper specimen collection / handling, submission of specimen other than nasopharyngeal swab, presence of viral mutation(s) within the areas targeted by this assay, and inadequate number of viral copies (<250 copies / mL). A negative result must be combined with clinical observations, patient history, and epidemiological  information.  Fact Sheet for Patients:   BoilerBrush.com.cy  Fact Sheet for Healthcare Providers: https://pope.com/  This test is not yet approved or  cleared by the [place] FDA and has been authorized for detection and/or diagnosis of SARS-CoV-2 by FDA under an Emergency Use Authorization (EUA).  This EUA will remain in effect (meaning this test can be used) for the duration of the COVID-19 declaration under Section 564(b)(1) of the Act, 21 U.S.C. section 360bbb-3(b)(1), unless the authorization is terminated or revoked sooner.  Performed at [facility], [address] [place], [place] [address]   Blood Culture (routine x 2)     Status: None (Preliminary result)   Collection Time: 10/16/19  1:58 PM   Specimen: BLOOD  Result Value Ref Range Status   Specimen Description   Final    BLOOD RIGHT UPPER ARM Performed at [facility], [address], [place], [place] [address]    Special Requests   Final    BOTTLES DRAWN AEROBIC AND ANAEROBIC Blood Culture results may not be optimal due to an excessive volume of blood received in culture bottles Performed at [facility], [address], [place], [place] [address]    Culture   Final    NO GROWTH 3 DAYS Performed at [facility], [address], [place], [place] [address]    Report Status PENDING  Incomplete  Blood Culture (routine x 2)     Status: None (Preliminary result)   Collection Time: 10/16/19  1:58 PM   Specimen: BLOOD  Result Value Ref Range Status   Specimen Description   Final    BLOOD RIGHT ANTECUBITAL Performed at [facility], [address] [place], [place] [address]    Special Requests   Final    BOTTLES DRAWN AEROBIC AND ANAEROBIC Blood Culture adequate volume Performed at [facility], [address], [place], [place] [address]    Culture   Final    NO  GROWTH 3 DAYS Performed at [facility], [address], [place], [place] [address]    Report Status PENDING  Incomplete     Studies: DG Chest 2 View  Result Date: 10/19/2019 CLINICAL DATA:  Larey Seat, weakness EXAM: CHEST - 2 VIEW COMPARISON:  10/16/2019 FINDINGS: Frontal and lateral views of the chest demonstrates stable enlargement of the  cardiac silhouette. There are bilateral pleural effusions, left greater than right. Persistent areas of bibasilar consolidation are seen, greatest at the left lung base. Decreased central vascular congestion. No pneumothorax. No acute displaced fracture. IMPRESSION: 1. Persistent bilateral pleural effusions and bibasilar atelectasis. Overall improved volume status with decreased central vascular congestion. Electronically Signed   By: Sharlet Salina M.D.   On: 10/19/2019 03:44   CT HEAD WO CONTRAST  Result Date: 10/19/2019 CLINICAL DATA:  Head trauma, HIV EXAM: CT HEAD WITHOUT CONTRAST TECHNIQUE: Contiguous axial images were obtained from the base of the skull through the vertex without intravenous contrast. COMPARISON:  None. FINDINGS: Brain: No acute infarct or hemorrhage. Lateral ventricles and midline structures are unremarkable. No acute extra-axial fluid collections. No mass effect. Vascular: No hyperdense vessel or unexpected calcification. Skull: Normal. Negative for fracture or focal lesion. Sinuses/Orbits: There is significant left periorbital soft tissue swelling. No acute displaced fractures. Other: None. IMPRESSION: 1. Left periorbital soft tissue swelling. 2. No acute intracranial process. Electronically Signed   By: Sharlet Salina M.D.   On: 10/19/2019 03:43      Joycelyn Das, MD  [facility] 10/20/2019

## 2019-10-20 NOTE — Progress Notes (Signed)
Progress Note  Patient Name: Alyssa Barnett Date of Encounter: 10/20/2019  Primary Cardiologist: No primary care provider on file.   Subjective   NAEO. Anxious this morning asking about anti-anxiety and pain medications.   Inpatient Medications    Scheduled Meds: . aspirin EC  81 mg Oral Daily  . cloNIDine  0.1 mg Oral BH-qamhs   Followed by  . [START ON 10/21/2019] cloNIDine  0.1 mg Oral QAC breakfast  . Darunavir-Cobicisctat-Emtricitabine-Tenofovir Alafenamide  1 tablet Oral Q breakfast  . enoxaparin (LOVENOX) injection  40 mg Subcutaneous Q24H  . losartan  25 mg Oral Daily  . nicotine  14 mg Transdermal Daily  . sodium chloride flush  3 mL Intravenous Q12H   Continuous Infusions: . sodium chloride     PRN Meds: sodium chloride, dicyclomine, hydrOXYzine, loperamide, methocarbamol, ondansetron, sodium chloride flush, traMADol   Vital Signs    Vitals:   10/20/19 0008 10/20/19 0215 10/20/19 0242 10/20/19 0550  BP: (!) 117/99 (!) 128/103 (!) 125/95 (!) 109/92  Pulse: 92 86 86 89  Resp: 16 18    Temp:    97.6 F (36.4 C)  TempSrc:    Oral  SpO2: 99% 99% 99% 95%  Weight:    52.9 kg  Height:        Intake/Output Summary (Last 24 hours) at 10/20/2019 0821 Last data filed at 10/20/2019 0550 Gross per 24 hour  Intake 360 ml  Output --  Net 360 ml   Filed Weights   10/17/19 1359 10/18/19 0600 10/20/19 0550  Weight: 53.6 kg 56.7 kg 52.9 kg    Telemetry    Sinus rhythm. - Personally Reviewed   Physical Exam   GEN: Anxious   Neck: No JVD Cardiac: RRR, no murmurs, rubs, or gallops.  Respiratory: Clear to auscultation bilaterally. GI: Soft, nontender, non-distended  MS: No edema; No deformity. Neuro:  Nonfocal  Psych: Normal affect   Labs    Chemistry Recent Labs  Lab 10/16/19 1332 10/17/19 0630 10/18/19 0453 10/19/19 0534 10/20/19 0605  NA 128*   < > 131* 128* 135  K 5.7*   < > 3.6 4.2 4.5  CL 98   < > 95* 98 103  CO2 19*   < > 24 23 23    GLUCOSE 134*   < > 130* 97 97  BUN 15   < > 19 17 14   CREATININE 1.08*   < > 1.55* 1.20* 0.99  CALCIUM 8.5*   < > 8.0* 8.0* 8.5*  PROT 6.8  --   --   --  7.2  ALBUMIN 2.8*  --   --   --  2.8*  AST 62*  --   --   --  29  ALT 22  --   --   --  19  ALKPHOS 54  --   --   --  58  BILITOT 0.8  --   --   --  0.4  GFRNONAA >60   < > 41* 56* >60  GFRAA >60   < > 47* >60 >60  ANIONGAP 11   < > 12 7 9    < > = values in this interval not displayed.     Hematology Recent Labs  Lab 10/16/19 1332 10/17/19 0630  WBC 5.3 4.3  RBC 4.83 4.71  HGB 12.6 12.6  HCT 39.4 39.5  MCV 81.6 83.9  MCH 26.1 26.8  MCHC 32.0 31.9  RDW 14.1 13.9  PLT 173 152  Cardiac EnzymesNo results for input(s): TROPONINI in the last 168 hours. No results for input(s): TROPIPOC in the last 168 hours.   BNP Recent Labs  Lab 10/16/19 1332  BNP 1,929.2*     DDimer  Recent Labs  Lab 10/16/19 1358  DDIMER 2.58*     Radiology    DG Chest 2 View  Result Date: 10/19/2019 CLINICAL DATA:  Larey Seat, weakness EXAM: CHEST - 2 VIEW COMPARISON:  10/16/2019 FINDINGS: Frontal and lateral views of the chest demonstrates stable enlargement of the cardiac silhouette. There are bilateral pleural effusions, left greater than right. Persistent areas of bibasilar consolidation are seen, greatest at the left lung base. Decreased central vascular congestion. No pneumothorax. No acute displaced fracture. IMPRESSION: 1. Persistent bilateral pleural effusions and bibasilar atelectasis. Overall improved volume status with decreased central vascular congestion. Electronically Signed   By: Sharlet Salina M.D.   On: 10/19/2019 03:44   CT HEAD WO CONTRAST  Result Date: 10/19/2019 CLINICAL DATA:  Head trauma, HIV EXAM: CT HEAD WITHOUT CONTRAST TECHNIQUE: Contiguous axial images were obtained from the base of the skull through the vertex without intravenous contrast. COMPARISON:  None. FINDINGS: Brain: No acute infarct or hemorrhage. Lateral  ventricles and midline structures are unremarkable. No acute extra-axial fluid collections. No mass effect. Vascular: No hyperdense vessel or unexpected calcification. Skull: Normal. Negative for fracture or focal lesion. Sinuses/Orbits: There is significant left periorbital soft tissue swelling. No acute displaced fractures. Other: None. IMPRESSION: 1. Left periorbital soft tissue swelling. 2. No acute intracranial process. Electronically Signed   By: Sharlet Salina M.D.   On: 10/19/2019 03:43    Patient Profile     44 y.o. female with a hx of untreated HIVand polysubstance abusewho is being seen today for the evaluation ofchest painand new systolic CHFat the request ofDr. Jarvis Newcomer.  Assessment & Plan    1. 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 NYHA II symptoms today. - losartan started yesterday at 25mg , tolerated well. Renal function stable - will require close outpatient follow up to continue titration of her medical therapy and consider coronary evaluation  2. Untreated HIV - ID following  3. Polysubstance Abuse Asking about anxiety and pain medications today.  4. Tobacco Abuse  5. AKI Now resolved. Lab Results  Component Value Date   CREATININE 0.99 10/20/2019   CREATININE 1.20 (H) 10/19/2019   CREATININE 1.55 (H) 10/18/2019    For questions or updates, please contact [facility] Please consult [email or web address] for contact info under Cardiology/STEMI.     10/20/2019 T. Sheria Lang, MD, [facility] Cardiac Electrophysiology

## 2019-10-21 DIAGNOSIS — I5043 Acute on chronic combined systolic (congestive) and diastolic (congestive) heart failure: Secondary | ICD-10-CM

## 2019-10-21 LAB — BASIC METABOLIC PANEL
Anion gap: 8 (ref 5–15)
BUN: 16 mg/dL (ref 6–20)
CO2: 21 mmol/L — ABNORMAL LOW (ref 22–32)
Calcium: 8.5 mg/dL — ABNORMAL LOW (ref 8.9–10.3)
Chloride: 105 mmol/L (ref 98–111)
Creatinine, Ser: 1.04 mg/dL — ABNORMAL HIGH (ref 0.44–1.00)
GFR calc Af Amer: >60 mL/min (ref >60)
GFR calc non Af Amer: >60 mL/min (ref >60)
Glucose, Bld: 86 mg/dL (ref 70–99)
Potassium: 4.5 mmol/L (ref 3.5–5.1)
Sodium: 134 mmol/L — ABNORMAL LOW (ref 135–145)

## 2019-10-21 LAB — PHOSPHORUS: Phosphorus: 3.3 mg/dL (ref 2.5–4.6)

## 2019-10-21 LAB — CBC
HCT: 42.2 % (ref 36.0–46.0)
Hemoglobin: 13.2 g/dL (ref 12.0–15.0)
MCH: 26.4 pg (ref 26.0–34.0)
MCHC: 31.3 g/dL (ref 30.0–36.0)
MCV: 84.4 fL (ref 80.0–100.0)
Platelets: 173 10*3/uL (ref 150–400)
RBC: 5 MIL/uL (ref 3.87–5.11)
RDW: 14.1 % (ref 11.5–15.5)
WBC: 4.2 10*3/uL (ref 4.0–10.5)
nRBC: 0 % (ref 0.0–0.2)

## 2019-10-21 LAB — CULTURE, BLOOD (ROUTINE X 2)
Culture: NO GROWTH
Culture: NO GROWTH
Special Requests: ADEQUATE

## 2019-10-21 LAB — MAGNESIUM: Magnesium: 1.9 mg/dL (ref 1.7–2.4)

## 2019-10-21 NOTE — Progress Notes (Signed)
Progress Note  Patient Name: Alyssa Barnett Date of Encounter: 10/21/2019  Primary Cardiologist: No primary care provider on file.   Subjective   NAEO. Asking me about pain medications this morning.  Inpatient Medications    Scheduled Meds: . aspirin EC  81 mg Oral Daily  . cloNIDine  0.1 mg Oral QAC breakfast  . Darunavir-Cobicisctat-Emtricitabine-Tenofovir Alafenamide  1 tablet Oral Q breakfast  . enoxaparin (LOVENOX) injection  40 mg Subcutaneous Q24H  . losartan  25 mg Oral Daily  . nicotine  14 mg Transdermal Daily  . sodium chloride flush  3 mL Intravenous Q12H   Continuous Infusions: . sodium chloride     PRN Meds: sodium chloride, dicyclomine, hydrOXYzine, loperamide, methocarbamol, ondansetron, sodium chloride flush, traMADol, traZODone   Vital Signs    Vitals:   10/20/19 2200 10/21/19 0500 10/21/19 0546 10/21/19 0756  BP:   118/90 (!) 131/101  Pulse: 86     Resp: 20     Temp:   97.7 F (36.5 C)   TempSrc:   Oral   SpO2: 94%     Weight:  54.8 kg    Height:        Intake/Output Summary (Last 24 hours) at 10/21/2019 0801 Last data filed at 10/20/2019 1030 Gross per 24 hour  Intake 720 ml  Output 1 ml  Net 719 ml   Filed Weights   10/18/19 0600 10/20/19 0550 10/21/19 0500  Weight: 56.7 kg 52.9 kg 54.8 kg    Telemetry    Sinus rhythm. - Personally Reviewed  Physical Exam   GEN: No acute distress eating breakfast   Neck: No JVD Cardiac: RRR, no murmurs, rubs, or gallops.  Respiratory: Clear to auscultation bilaterally. GI: Soft, nontender, non-distended  MS: No edema; No deformity. Neuro:  Nonfocal  Psych: Normal affect    Labs    Chemistry Recent Labs  Lab 10/16/19 1332 10/17/19 0630 10/19/19 0534 10/20/19 0605 10/21/19 0539  NA 128*   < > 128* 135 134*  K 5.7*   < > 4.2 4.5 4.5  CL 98   < > 98 103 105  CO2 19*   < > 23 23 21*  GLUCOSE 134*   < > 97 97 86  BUN 15   < > 17 14 16   CREATININE 1.08*   < > 1.20* 0.99 1.04*    CALCIUM 8.5*   < > 8.0* 8.5* 8.5*  PROT 6.8  --   --  7.2  --   ALBUMIN 2.8*  --   --  2.8*  --   AST 62*  --   --  29  --   ALT 22  --   --  19  --   ALKPHOS 54  --   --  58  --   BILITOT 0.8  --   --  0.4  --   GFRNONAA >60   < > 56* >60 >60  GFRAA >60   < > >60 >60 >60  ANIONGAP 11   < > 7 9 8    < > = values in this interval not displayed.     Hematology Recent Labs  Lab 10/16/19 1332 10/17/19 0630 10/21/19 0539  WBC 5.3 4.3 4.2  RBC 4.83 4.71 5.00  HGB 12.6 12.6 13.2  HCT 39.4 39.5 42.2  MCV 81.6 83.9 84.4  MCH 26.1 26.8 26.4  MCHC 32.0 31.9 31.3  RDW 14.1 13.9 14.1  PLT 173 152 173    Cardiac  EnzymesNo results for input(s): TROPONINI in the last 168 hours. No results for input(s): TROPIPOC in the last 168 hours.   BNP Recent Labs  Lab 10/16/19 1332  BNP 1,929.2*     DDimer  Recent Labs  Lab 10/16/19 1358  DDIMER 2.58*     Radiology    No results found.  Patient Profile     44 y.o. female with a hx of untreated HIVand polysubstance abusewho is being seen for the evaluation ofchest painand new systolic CHFat the request ofDr. Jarvis Newcomer.  Assessment & Plan    1. 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 NYHA II symptoms today. Lungs clear no peripheral edema.  - continue losartan 25mg  - will consider adding metoprolol in HF clinic - will require close outpatient follow up to continue titration of her medical therapy and consider coronary evaluation - HF clinic referral  2. Untreated HIV - ID following  3. Polysubstance Abuse Asking about anxiety and pain medications today.  4. Tobacco Abuse  5. AKI Now resolved.   For questions or updates, please contact [facility] Please consult [email or web address] for contact info under Cardiology/STEMI.     T. Sheria Lang, MD, [facility] Cardiac Electrophysiology

## 2019-10-21 NOTE — TOC Transition Note (Signed)
Transition of Care [facility]) - CM/SW Discharge Note   Patient Details  Name: Alyssa Barnett MRN: [id number] Date of Birth: [birth year]  [facility] Care [facility] CM/SW Contact:  Coralyn Helling, LCSW Phone Number: 10/21/2019, 11:19 AM   Clinical Narrative:   LCSW consulted at dc for meds to bed and transportation. LCSW provided floor RN with weekend transportation number. RN asked about meds to bed, but its Sunday pharmacy is closed. LCSW attempted to access patient. Patient became upset when LCSW mentioned discharge and hung up the phone. Patient has had multiple hospital visits in the past 2 weeks. Patient is from [place], currently living in a motel. In a previous visit patient stated that she is not homeless and can afford to [facility].   Plan: Patient to dc [facility]. Patient will need transportation. TOC assist with meds and set up PCP prior to DC.      Barriers to Discharge: Barriers Resolved   Patient Goals and CMS Choice Patient states their goals for this hospitalization and ongoing recovery are:: To get better, so she can return to [facility] to get her property.      Discharge Placement                       Discharge Plan and Services                                     Social Determinants of Health (SDOH) Interventions     Readmission Risk Interventions No flowsheet data found.

## 2019-10-21 NOTE — Progress Notes (Addendum)
PROGRESS NOTE  Alyssa Barnett [facility] BMW:[id number] DOB: 1975/08/15 DOA: 10/16/2019 PCP: Patient, No Pcp Per   LOS: 5 days   Brief narrative: As per HPI,  Alyssa Barnett a 44 y.o.femalewith medical history significant for HIV, AIDS polysubstance abuse presented to the hospital with shortness of breath, chest discomfort and hypoxia.  She had initially came to the ED hypoxic with elevated D-dimer and was unable to tolerate CT scan so she left AMA. She then presented with pleuritic chest pain.  Urine drug screen was positive for cocaine.  Patient was started on Lasix and antibiotics in the ED.  CT scan did not show any evidence of PE but patient was admitted to hospital for further evaluation and treatment.  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  Patient was seen by ID during hospitalization and has been started on antiretroviral therapy. On symtuza.  CD4 count of 476.  HIV RNA pending.  Hepatitis B DNA +   QuantiFERON gold noted.  Hepatitis B DNA positive.  Mildly elevated LFT.  Infectious disease on board.  Will need to discuss with ID regarding this in terms of future follow-up and treatment plan.  New acute combined CHF: Presented with elevated BNP and interstitial edema.  2D echocardiogram showed ejection fraction of 25 to 30% with global hypokinesis, moderately reduced RV systolic function suggestive of biventricular failure.  Cardiology on board.  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  HIV and infiltrative disease also possible.  Patient initially received Lasix on hold due to elevated creatinine.  Continue daily intake and output charting.  Daily weights.  Avoiding beta-blockers due to cocaine on board.  Low-dose  ARB has been initiated.  Follow-up amyloid labs..  Pneumonia has been ruled out.  Off antibiotic.  Patient will need to be seen in heart failure clinic to adjust and titrate medications.  Fall with head trauma: CT head scan was negative.  Periorbital bruise noted.  AKI:  Has improved at this time.  Continue to hold NSAIDs and Lasix at this time.  Dose ARB  Troponin elevation: Likely decompensated congestive heart failure and cocaine abuse.  Cardiology not planning for ischemic evaluation.   Polysubstance abuse with anxiety Including marijuana and cocaine abuse.  Concern for opiate abuse, counseling done.  On clonidine, Atarax.  Received clonidine taper.  Tobacco dependence -Continue nicotine patch.    DVT prophylaxis: enoxaparin (LOVENOX) injection 40 mg Start: 10/17/19 2200   Code Status: Full code  Family Communication: None.  No contact listed.  Status is: Inpatient  Remains inpatient appropriate because:Inpatient level of care appropriate due to severity of illness, cardiology titrating medication, chance of recurrent admission to the hospital due to polysubstance abuse, unfavorable living situation.  Currently lives in a motel.   Dispo: The patient is from: Home              Anticipated d/c is to: Home, patient has a high risk of recurrent admission to the hospital due to noncompliance and polysubstance abuse.  She will need medication assistance and assistance with transition on discharge.  She has significant systolic dysfunction              Anticipated d/c date is: Likely tomorrow.  Communicated with transition of care regarding  arrangements on discharge.              Patient currently is not medically stable to d/c.  Consultants:  Cardiology  Infectious disease  Procedures:  2D echocardiogram  Antibiotics:  . None  Anti-infectives (From admission, onward)   Start     Dose/Rate Route Frequency Ordered Stop   10/18/19 0000   Darunavir-Cobicisctat-Emtricitabine-Tenofovir Alafenamide (SYMTUZA) 800-150-200-10 MG TABS       Note to Pharmacy: 1 time fill please. PT assistance: [id number] Group: [id number] ID: [id number]   1 tablet Oral Daily with breakfast 10/18/19 0839     10/17/19 1000  Darunavir-Cobicisctat-Emtricitabine-Tenofovir Alafenamide (SYMTUZA) 800-150-200-10 MG TABS 1 tablet        1 tablet Oral Daily with breakfast 10/17/19 0909     10/16/19 1330  cefTRIAXone (ROCEPHIN) 2 g in sodium chloride 0.9 % 100 mL IVPB  Status:  Discontinued        2 g 200 mL/hr over 30 Minutes Intravenous Every 24 hours 10/16/19 1325 10/17/19 1430   10/16/19 1330  azithromycin (ZITHROMAX) 500 mg in sodium chloride 0.9 % 250 mL IVPB  Status:  Discontinued        500 mg 250 mL/hr over 60 Minutes Intravenous Every 24 hours 10/16/19 1325 10/17/19 1430     Subjective: Today, patient was seen and examined at bedside.  Patient denies any shortness of breath chest pain palpitation.  Feels fatigued and weak.  Objective: Vitals:   10/21/19 0546 10/21/19 0756  BP: 118/90 (!) 131/101  Pulse:    Resp:    Temp: 97.7 F (36.5 C)   SpO2:      Intake/Output Summary (Last 24 hours) at 10/21/2019 1246 Last data filed at 10/21/2019 1221 Gross per 24 hour  Intake 240 ml  Output --  Net 240 ml   Filed Weights   10/18/19 0600 10/20/19 0550 10/21/19 0500  Weight: 56.7 kg 52.9 kg 54.8 kg   Body mass index is 20.74 kg/m.   Physical Exam: GENERAL: Patient is alert awake and oriented.  Anxious not in obvious distress. HENT: No scleral pallor or icterus. Pupils equally reactive to light. Oral mucosa is moist.  Left periorbital ecchymosis.  Ocular movements are intact.  Vision intact. NECK: is supple, no gross swelling noted. CHEST: Diminished breath sounds bilateral. CVS: S1 and S2 heard, no murmur. Regular rate and rhythm.  ABDOMEN: Soft, non-tender, bowel sounds are present. EXTREMITIES: No edema. CNS: Cranial nerves are intact. No  focal motor deficits. SKIN: warm and dry without rashes.  Data Review: I have personally reviewed the following laboratory data and studies,  CBC: Recent Labs  Lab 10/16/19 1332 10/17/19 0630 10/21/19 0539  WBC 5.3 4.3 4.2  NEUTROABS  --  2.5  --   HGB 12.6 12.6 13.2  HCT 39.4 39.5 42.2  MCV 81.6 83.9 84.4  PLT 173 152 173   Basic Metabolic Panel: Recent Labs  Lab 10/17/19 0630 10/18/19 0453 10/19/19 0534 10/20/19 0605 10/21/19 0539  NA 131* 131* 128* 135 134*  K 3.7 3.6 4.2 4.5 4.5  CL 103 95* 98 103 105  CO2 20* 24 23 23  21*  GLUCOSE 112* 130* 97 97 86  BUN 19 19 17 14 16   CREATININE 1.17* 1.55* 1.20* 0.99 1.04*  CALCIUM 7.5* 8.0* 8.0* 8.5* 8.5*  MG  --   --   --   --  1.9  PHOS  --   --   --   --  3.3   Liver  Function Tests: Recent Labs  Lab 10/16/19 1332 10/20/19 0605  AST 62* 29  ALT 22 19  ALKPHOS 54 58  BILITOT 0.8 0.4  PROT 6.8 7.2  ALBUMIN 2.8* 2.8*   Recent Labs  Lab 10/16/19 1332  LIPASE 26   No results for input(s): AMMONIA in the last 168 hours. Cardiac Enzymes: No results for input(s): CKTOTAL, CKMB, CKMBINDEX, TROPONINI in the last 168 hours. BNP (last 3 results) Recent Labs    10/16/19 1332  BNP 1,929.2*    ProBNP (last 3 results) No results for input(s): PROBNP in the last 8760 hours.  CBG: No results for input(s): GLUCAP in the last 168 hours. Recent Results (from the past 240 hour(s))  SARS Coronavirus 2 by RT PCR (hospital order, performed in [facility] hospital lab) Nasopharyngeal Nasopharyngeal Swab     Status: None   Collection Time: 10/16/19  1:32 PM   Specimen: Nasopharyngeal Swab  Result Value Ref Range Status   SARS Coronavirus 2 NEGATIVE NEGATIVE Final    Comment: (NOTE) SARS-CoV-2 target nucleic acids are NOT DETECTED.  The SARS-CoV-2 RNA is generally detectable in upper and lower respiratory specimens during the acute phase of infection. The lowest concentration of SARS-CoV-2 viral copies this assay can  detect is 250 copies / mL. A negative result does not preclude SARS-CoV-2 infection and should not be used as the sole basis for treatment or other patient management decisions.  A negative result may occur with improper specimen collection / handling, submission of specimen other than nasopharyngeal swab, presence of viral mutation(s) within the areas targeted by this assay, and inadequate number of viral copies (<250 copies / mL). A negative result must be combined with clinical observations, patient history, and epidemiological information.  Fact Sheet for Patients:   BoilerBrush.com.cy  Fact Sheet for Healthcare Providers: https://pope.com/  This test is not yet approved or  cleared by the [place] FDA and has been authorized for detection and/or diagnosis of SARS-CoV-2 by FDA under an Emergency Use Authorization (EUA).  This EUA will remain in effect (meaning this test can be used) for the duration of the COVID-19 declaration under Section 564(b)(1) of the Act, 21 U.S.C. section 360bbb-3(b)(1), unless the authorization is terminated or revoked sooner.  Performed at [facility], [address] [place], [place] [address]   Blood Culture (routine x 2)     Status: None (Preliminary result)   Collection Time: 10/16/19  1:58 PM   Specimen: BLOOD  Result Value Ref Range Status   Specimen Description   Final    BLOOD RIGHT UPPER ARM Performed at [facility], [address], [place], [place] [address]    Special Requests   Final    BOTTLES DRAWN AEROBIC AND ANAEROBIC Blood Culture results may not be optimal due to an excessive volume of blood received in culture bottles Performed at [facility], [address], [place], [place] [address]    Culture   Final    NO GROWTH 4 DAYS Performed at [facility], [address] [place], [place] [address]    Report Status  PENDING  Incomplete  Blood Culture (routine x 2)     Status: None (Preliminary result)   Collection Time: 10/16/19  1:58 PM   Specimen: BLOOD  Result Value Ref Range Status   Specimen Description   Final    BLOOD RIGHT ANTECUBITAL Performed at [facility], [address] [place], [place] [address]  Special Requests   Final    BOTTLES DRAWN AEROBIC AND ANAEROBIC Blood Culture adequate volume Performed at [facility], [address], [place], [place] [address]    Culture   Final    NO GROWTH 4 DAYS Performed at [facility], [address] [place], [place] [address]    Report Status PENDING  Incomplete     Studies: No results found.    Joycelyn Das, MD  [facility] 10/21/2019

## 2019-10-22 ENCOUNTER — Telehealth: Payer: Self-pay | Admitting: Cardiovascular Disease

## 2019-10-22 DIAGNOSIS — I501 Left ventricular failure: Secondary | ICD-10-CM

## 2019-10-22 LAB — CBC
HCT: 41.2 % (ref 36.0–46.0)
Hemoglobin: 12.7 g/dL (ref 12.0–15.0)
MCH: 25.9 pg — ABNORMAL LOW (ref 26.0–34.0)
MCHC: 30.8 g/dL (ref 30.0–36.0)
MCV: 83.9 fL (ref 80.0–100.0)
Platelets: 164 10*3/uL (ref 150–400)
RBC: 4.91 MIL/uL (ref 3.87–5.11)
RDW: 14 % (ref 11.5–15.5)
WBC: 4.2 10*3/uL (ref 4.0–10.5)
nRBC: 0 % (ref 0.0–0.2)

## 2019-10-22 LAB — MULTIPLE MYELOMA PANEL, SERUM
Albumin SerPl Elph-Mcnc: 2.4 g/dL — ABNORMAL LOW (ref 2.9–4.4)
Albumin/Glob SerPl: 0.7 (ref 0.7–1.7)
Alpha 1: 0.3 g/dL (ref 0.0–0.4)
Alpha2 Glob SerPl Elph-Mcnc: 0.6 g/dL (ref 0.4–1.0)
B-Globulin SerPl Elph-Mcnc: 0.8 g/dL (ref 0.7–1.3)
Gamma Glob SerPl Elph-Mcnc: 1.8 g/dL (ref 0.4–1.8)
Globulin, Total: 3.5 g/dL (ref 2.2–3.9)
IgA: 390 mg/dL — ABNORMAL HIGH (ref 87–352)
IgG (Immunoglobin G), Serum: 1750 mg/dL — ABNORMAL HIGH (ref 586–1602)
IgM (Immunoglobulin M), Srm: 212 mg/dL (ref 26–217)
M Protein SerPl Elph-Mcnc: 0.3 g/dL — ABNORMAL HIGH
Total Protein ELP: 5.9 g/dL — ABNORMAL LOW (ref 6.0–8.5)

## 2019-10-22 LAB — HIV-1 RNA QUANT-NO REFLEX-BLD
HIV 1 RNA Quant: 3030 copies/mL
LOG10 HIV-1 RNA: 3.481 log10copy/mL

## 2019-10-22 LAB — BASIC METABOLIC PANEL
Anion gap: 9 (ref 5–15)
BUN: 17 mg/dL (ref 6–20)
CO2: 22 mmol/L (ref 22–32)
Calcium: 8.5 mg/dL — ABNORMAL LOW (ref 8.9–10.3)
Chloride: 104 mmol/L (ref 98–111)
Creatinine, Ser: 1.04 mg/dL — ABNORMAL HIGH (ref 0.44–1.00)
GFR calc Af Amer: >60 mL/min (ref >60)
GFR calc non Af Amer: >60 mL/min (ref >60)
Glucose, Bld: 113 mg/dL — ABNORMAL HIGH (ref 70–99)
Potassium: 4.6 mmol/L (ref 3.5–5.1)
Sodium: 135 mmol/L (ref 135–145)

## 2019-10-22 LAB — MAGNESIUM: Magnesium: 1.7 mg/dL (ref 1.7–2.4)

## 2019-10-22 LAB — HLA B*5701: HLA B 5701: NEGATIVE

## 2019-10-22 MED ORDER — LOSARTAN POTASSIUM 25 MG PO TABS
25.0000 mg | ORAL_TABLET | Freq: Every day | ORAL | 4 refills | Status: DC
Start: 2019-10-23 — End: 2020-12-14

## 2019-10-22 MED ORDER — ASPIRIN 81 MG PO TBEC
81.0000 mg | DELAYED_RELEASE_TABLET | Freq: Every day | ORAL | 11 refills | Status: DC
Start: 2019-10-23 — End: 2020-12-17

## 2019-10-22 MED ORDER — NICOTINE 14 MG/24HR TD PT24: 14.0000 mg | MEDICATED_PATCH | Freq: Every day | TRANSDERMAL | 0 refills | Status: DC

## 2019-10-22 MED FILL — LOSARTAN POTASSIUM 25 MG TA: 25 | 30 days supply | Qty: 30 | Fill #0

## 2019-10-22 NOTE — Discharge Instructions (Signed)

## 2019-10-22 NOTE — Plan of Care (Signed)
  Problem: Education: Goal: Knowledge of General Education information will improve Description: Including pain rating scale, medication(s)/side effects and non-pharmacologic comfort measures Outcome: Adequate for Discharge   Problem: Clinical Measurements: Goal: Ability to maintain clinical measurements within normal limits will improve Outcome: Adequate for Discharge   Problem: Activity: Goal: Risk for activity intolerance will decrease Outcome: Adequate for Discharge   Problem: Elimination: Goal: Will not experience complications related to bowel motility Outcome: Adequate for Discharge   Problem: Nutrition: Goal: Adequate nutrition will be maintained Outcome: Completed/Met   Problem: Coping: Goal: Level of anxiety will decrease Outcome: Completed/Met

## 2019-10-22 NOTE — Telephone Encounter (Signed)
Patient is currently still admitted at this time.
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
## 2019-10-22 NOTE — Discharge Summary (Signed)
Physician Discharge Summary  Lakenzie Mcclafferty [facility] PYP:[id number] DOB: 07/30/1975 DOA: 10/16/2019  PCP: Patient, No Pcp Per  Admit date: 10/16/2019 Discharge date: 10/22/2019  Admitted From: motel Disposition:motel Recommendations for Outpatient Follow-up:  1. Follow up with PCP in 1-2 weeks 2. Please obtain BMP/CBC in one week 3. Please follow up  ID and cardiology  Home Health: None Equipment/Devices none Discharge Condition: Stable CODE STATUS full code Diet recommendation: Cardiac diet Brief/Interim Summary:Shuntay Bentleigh Waren a 44 y.o.femalewith medical history significant for HIV, AIDS polysubstance abuse presented to the hospital with shortness of breath, chest discomfort and hypoxia.  She had initially came to the ED hypoxic with elevated D-dimer and was unable to tolerate CT scan so she left AMA. She then presented with pleuritic chest pain.  Urine drug screen was positive for cocaine.  Patient was started on Lasix and antibiotics in the ED.  CT scan did not show any evidence of PE but patient was admitted to hospital for further evaluation and treatment.   Discharge Diagnoses:  Principal Problem:   Acquired immunodeficiency syndrome (AIDS) with bacterial pneumonia (HCC) Active Problems:   New onset of congestive heart failure (HCC)   Polysubstance abuse (HCC)   Tobacco dependence   Fall   Heart failure, left, with LVEF <=30% (HCC)  HIV disease, untreated.  Patient was seen by ID during hospitalization and has been started on antiretroviral therapy. On symtuza.  CD4 count of 476.  HIV RNA pending.  Hepatitis B DNA +  .  Will need to follow-up with infectious disease as an outpatient.  Hepatitis B DNA positive.  Mildly elevated LFT.  Infectious disease on board.  Follow-up with ID on discharge.  New acute combined CHF: Presented with elevated BNP and interstitial edema.   2D echocardiogram showed ejection fraction of 25 to 30% with global hypokinesis, moderately reduced RV  systolic function suggestive of biventricular failure.  Patient was seen by cardiology during this hospital stay.  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   HIV and infiltrative disease also possible.  Patient was treated with low-dose ARB and aspirin.  Fall with head trauma: CT head scan was negative.  Periorbital bruise noted.  AKI: Improved after holding Lasix and NSAIDs.  Creatinine 1.04 on the day of discharge.  Troponin elevation: Likely decompensated congestive heart failure and cocaine abuse.  Cardiology not planning for ischemic evaluation.   Polysubstance abuse with anxiety Including marijuana and cocaine abuse.    Was treated with clonidine detox protocol.    Tobacco dependence -Continue nicotine patch.   Estimated body mass index is 21.04 kg/m as calculated from the following:   Height as of this encounter: 5\' 4"  (1.626 m).   Weight as of this encounter: 55.6 kg.  Discharge Instructions  Discharge Instructions    Diet - low sodium heart healthy   Complete by: As directed    Increase activity slowly   Complete by: As directed      Allergies as of 10/22/2019      Reactions   Flagyl [metronidazole] Nausea And Vomiting   Pt states severe, violent vomiting.   Cefaclor Itching      Medication List    TAKE these medications   aspirin 81 MG EC tablet Take 1 tablet (81 mg total) by mouth daily. Swallow whole. Start taking on: October 23, 2019   losartan 25 MG tablet Commonly known as: COZAAR Take 1 tablet (25 mg total) by mouth daily. Start taking on: October 23, 2019   nicotine 14  mg/24hr patch Commonly known as: NICODERM CQ - dosed in mg/24 hours Place 1 patch (14 mg total) onto the skin daily. Start taking on: October 23, 2019   Symtuza 800-150-200-10 MG Tabs Generic drug: Darunavir-Cobicisctat-Emtricitabine-Tenofovir Alafenamide Take 1 tablet by mouth daily with breakfast.       Follow-up Information    [facility]. Schedule an appointment as soon as possible for a visit.   Why: call for PCP appt;Go there for pharmacy needs also. Contact information: [address] [place] [address] [phone]       Ginnie Smart, MD Follow up.   Specialty: Infectious Diseases Contact information: [address] [place] [address] [phone]              Allergies  Allergen Reactions  . Flagyl [Metronidazole] Nausea And Vomiting    Pt states severe, violent vomiting.  . Cefaclor Itching    Consultations:  Infectious disease, cardiology   Procedures/Studies: DG Chest 2 View  Result Date: 10/19/2019 CLINICAL DATA:  Larey Seat, weakness EXAM: CHEST - 2 VIEW COMPARISON:  10/16/2019 FINDINGS: Frontal and lateral views of the chest demonstrates stable enlargement of the cardiac silhouette. There are bilateral pleural effusions, left greater than right. Persistent areas of bibasilar consolidation are seen, greatest at the left lung base. Decreased central vascular congestion. No pneumothorax. No acute displaced fracture. IMPRESSION: 1. Persistent bilateral pleural effusions and bibasilar atelectasis. Overall improved volume status with decreased central vascular congestion. Electronically Signed   By: Sharlet Salina M.D.   On: 10/19/2019 03:44   DG Chest 2 View  Result Date: 10/16/2019 CLINICAL DATA:  Pneumonia, productive cough. EXAM: CHEST - 2 VIEW COMPARISON:  10/04/2019 FINDINGS: Stable top-normal heart size. Worsening bilateral lower lobe no filtrates with associated small bilateral pleural effusions. Possible overlying mild pulmonary interstitial edema. No pneumothorax. IMPRESSION: Worsening bilateral lower lobe infiltrates with associated small bilateral pleural effusions. Possible mild overlying pulmonary interstitial edema. Electronically Signed   By: Irish Lack M.D.   On: 10/16/2019 11:33   DG Chest 2 View  Result Date: 10/04/2019 CLINICAL  DATA:  Chest pain.  Shortness of breath.  Weakness. EXAM: CHEST - 2 VIEW COMPARISON:  Radiograph 10/01/2019 FINDINGS: Worsening patchy and interstitial opacities at the lung bases with possible Kerley B-lines, suspect pulmonary edema. Mild cardiomegaly. There are small pleural effusions and fluid in the fissures. No pneumothorax. Mild right apical pleuroparenchymal scarring. No acute osseous abnormalities are seen. IMPRESSION: 1. Worsening basilar interstitial opacities suspicious for pulmonary edema. Possibility of atypical infection also considered. Small pleural effusions. 2. Mild cardiomegaly/enlargement of the pericardial silhouette. Electronically Signed   By: Narda Rutherford M.D.   On: 10/04/2019 12:45   DG Chest 2 View  Result Date: 10/01/2019 CLINICAL DATA:  Chest wall pain, confusion EXAM: CHEST - 2 VIEW COMPARISON:  None. FINDINGS: Mild enlargement of the cardiopericardial silhouette. Normal mediastinal contour. No pneumothorax. No pleural effusion. No overt pulmonary edema. Patchy hazy and reticular opacities in the lower lungs bilaterally. IMPRESSION: 1. Patchy hazy and reticular opacities in the lower lungs bilaterally, suspect nonspecific scarring, cannot exclude superimposed atypical infection. 2. Mild enlargement of the cardiopericardial silhouette. Electronically Signed   By: Delbert Phenix M.D.   On: 10/01/2019 09:16   CT HEAD WO CONTRAST  Result Date: 10/19/2019 CLINICAL DATA:  Head trauma, HIV EXAM: CT HEAD WITHOUT CONTRAST TECHNIQUE: Contiguous axial images were obtained from the base of the skull through the vertex without intravenous  contrast. COMPARISON:  None. FINDINGS: Brain: No acute infarct or hemorrhage. Lateral ventricles and midline structures are unremarkable. No acute extra-axial fluid collections. No mass effect. Vascular: No hyperdense vessel or unexpected calcification. Skull: Normal. Negative for fracture or focal lesion. Sinuses/Orbits: There is significant left  periorbital soft tissue swelling. No acute displaced fractures. Other: None. IMPRESSION: 1. Left periorbital soft tissue swelling. 2. No acute intracranial process. Electronically Signed   By: Michael  Brown M.D.   On: 09Sharlet Salina:43   CT ANGIO CHEST PE W OR WO CONTRAST  Result Date: 10/17/2019 CLINICAL DATA:  High probability for pulmonary embolism. History of substance abuse. EXAM: CT ANGIOGRAPHY CHEST WITH CONTRAST TECHNIQUE: Multidetector CT imaging of the chest was performed using the standard protocol during bolus administration of intravenous contrast. Multiplanar CT image reconstructions and MIPs were obtained to evaluate the vascular anatomy. CONTRAST:  66mL OMNIPAQUE IOHEXOL 350 MG/ML SOLN COMPARISON:  None. FINDINGS: Cardiovascular: Enlarged appearance of the heart. No significant pericardial effusion. No pulmonary artery filling defect Mediastinum/Nodes: Negative for adenopathy or mass. Lungs/Pleura: Moderate layering pleural effusions. Streaky opacity in the lower lungs with volume loss more consistent with atelectasis than consolidation. Minimal and nonspecific ground-glass opacity in the subpleural left upper lobe. Mild generalized interlobular septal thickening best seen on coronal reformats Upper Abdomen: Intrahepatic venous reflux. Small volume ascites seen about the liver. Musculoskeletal: No acute or aggressive finding. Degenerative facet and endplate changes seen at T11-12. Review of the MIP images confirms the above findings. IMPRESSION: 1. Cardiomegaly with mild interstitial edema and moderate pleural effusions. Small volume ascites is also seen in the upper abdomen. 2. Pulmonary opacities with morphology suggesting atelectasis. 3. Negative for pulmonary embolism. Electronically Signed   By: Marnee Spring M.D.   On: 10/17/2019 11:22   ECHOCARDIOGRAM COMPLETE  Result Date: 10/17/2019    ECHOCARDIOGRAM REPORT   Patient Name:   [facility] Fickett Offutt Date of Exam: 10/17/2019 Medical Rec #:   [id number]         Height:       64.0 in Accession #:    [id number]        Weight:       125.0 lb Date of Birth:  [birth year]         BSA:          1.602 m Patient Age:    44 years          BP:           137/92 mmHg Patient Gender: F                 HR:           73 bpm. Exam Location:  Inpatient Procedure: 2D Echo Indications:    CHF 428  History:        Patient has no prior history of Echocardiogram examinations.                 Risk Factors:Current Smoker. HIV. substance abuse.  Sonographer:    Celene Skeen RDCS (AE) Referring Phys: [id number] JENNIFER YATES  Sonographer Comments: patient had difficulty remaining awake during exam, as a result unable to follow directions at times. IMPRESSIONS  1. Left ventricular ejection fraction, by estimation, is 25 to 30%. The left ventricle has severely decreased function. The left ventricle demonstrates global hypokinesis. The left ventricular internal cavity size was mildly dilated. Left ventricular diastolic parameters are consistent with Grade II diastolic dysfunction (pseudonormalization).  2. Right ventricular systolic function is moderately  reduced. The right ventricular size is mildly enlarged. Mildly increased right ventricular wall thickness.  3. Left atrial size was mildly dilated.  4. Right atrial size was mildly dilated.  5. A small pericardial effusion is present.  6. The mitral valve is grossly normal. Mild mitral valve regurgitation. No evidence of mitral stenosis.  7. The aortic valve is grossly normal. Aortic valve regurgitation is not visualized. No aortic stenosis is present. FINDINGS  Left Ventricle: Left ventricular ejection fraction, by estimation, is 25 to 30%. The left ventricle has severely decreased function. The left ventricle demonstrates global hypokinesis. The left ventricular internal cavity size was mildly dilated. There is no left ventricular hypertrophy. Left ventricular diastolic parameters are consistent with Grade II diastolic dysfunction  (pseudonormalization). Right Ventricle: The right ventricular size is mildly enlarged. Mildly increased right ventricular wall thickness. Right ventricular systolic function is moderately reduced. Left Atrium: Left atrial size was mildly dilated. Right Atrium: Right atrial size was mildly dilated. Pericardium: A small pericardial effusion is present. Mitral Valve: The mitral valve is grossly normal. Mild mitral valve regurgitation. No evidence of mitral valve stenosis. Tricuspid Valve: The tricuspid valve is normal in structure. Tricuspid valve regurgitation is mild. Aortic Valve: The aortic valve is grossly normal. Aortic valve regurgitation is not visualized. No aortic stenosis is present. Pulmonic Valve: The pulmonic valve was grossly normal. Pulmonic valve regurgitation is not visualized. No evidence of pulmonic stenosis. Aorta: The aortic root and ascending aorta are structurally normal, with no evidence of dilitation. IAS/Shunts: The atrial septum is grossly normal.  LEFT VENTRICLE PLAX 2D LVIDd:         5.40 cm  Diastology LVIDs:         4.50 cm  LV e' medial:    4.90 cm/s LV PW:         1.30 cm  LV E/e' medial:  19.0 LV IVS:        1.00 cm  LV e' lateral:   9.90 cm/s LVOT diam:     1.80 cm  LV E/e' lateral: 9.4 LV SV:         42 LV SV Index:   26 LVOT Area:     2.54 cm  RIGHT VENTRICLE TAPSE (M-mode): 1.4 cm LEFT ATRIUM             Index LA diam:        2.70 cm 1.69 cm/m LA Vol (A2C):   63.4 ml 39.57 ml/m LA Vol (A4C):   59.6 ml 37.20 ml/m LA Biplane Vol: 61.1 ml 38.14 ml/m  AORTIC VALVE LVOT Vmax:   104.00 cm/s LVOT Vmean:  70.600 cm/s LVOT VTI:    0.166 m  AORTA Ao Root diam: 2.80 cm MITRAL VALVE MV Area (PHT): 4.60 cm    SHUNTS MV Decel Time: 165 msec    Systemic VTI:  0.17 m MV E velocity: 93.00 cm/s  Systemic Diam: 1.80 cm Kristeen Miss MD Electronically signed by Kristeen Miss MD Signature Date/Time: 10/17/2019/4:17:43 PM    Final     (Echo, Carotid, EGD, Colonoscopy, ERCP)     Subjective: Patient anxious to go home  Discharge Exam: Vitals:   10/22/19 0800 10/22/19 1011  BP: (!) 147/103 124/80  Pulse: 78 83  Resp: 18 (!) 24  Temp:  97.9 F (36.6 C)  SpO2: 100% 99%   Vitals:   10/22/19 0454 10/22/19 0455 10/22/19 0800 10/22/19 1011  BP: 128/90  (!) 147/103 124/80  Pulse: 81  78 83  Resp:  18  18 (!) 24  Temp: 97.7 F (36.5 C)   97.9 F (36.6 C)  TempSrc: Oral   Oral  SpO2: 98%  100% 99%  Weight:  55.6 kg    Height:        General: Pt is alert, awake, not in acute distress Cardiovascular: RRR, S1/S2 +, no rubs, no gallops Respiratory: CTA bilaterally, no wheezing, no rhonchi Abdominal: Soft, NT, ND, bowel sounds + Extremities: Trace edema   The results of significant diagnostics from this hospitalization (including imaging, microbiology, ancillary and laboratory) are listed below for reference.     Microbiology: Recent Results (from the past 240 hour(s))  SARS Coronavirus 2 by RT PCR (hospital order, performed in [facility] hospital lab) Nasopharyngeal Nasopharyngeal Swab     Status: None   Collection Time: 10/16/19  1:32 PM   Specimen: Nasopharyngeal Swab  Result Value Ref Range Status   SARS Coronavirus 2 NEGATIVE NEGATIVE Final    Comment: (NOTE) SARS-CoV-2 target nucleic acids are NOT DETECTED.  The SARS-CoV-2 RNA is generally detectable in upper and lower respiratory specimens during the acute phase of infection. The lowest concentration of SARS-CoV-2 viral copies this assay can detect is 250 copies / mL. A negative result does not preclude SARS-CoV-2 infection and should not be used as the sole basis for treatment or other patient management decisions.  A negative result may occur with improper specimen collection / handling, submission of specimen other than nasopharyngeal swab, presence of viral mutation(s) within the areas targeted by this assay, and inadequate number of viral copies (<250 copies / mL). A negative result  must be combined with clinical observations, patient history, and epidemiological information.  Fact Sheet for Patients:   BoilerBrush.com.cy  Fact Sheet for Healthcare Providers: https://pope.com/  This test is not yet approved or  cleared by the [place] FDA and has been authorized for detection and/or diagnosis of SARS-CoV-2 by FDA under an Emergency Use Authorization (EUA).  This EUA will remain in effect (meaning this test can be used) for the duration of the COVID-19 declaration under Section 564(b)(1) of the Act, 21 U.S.C. section 360bbb-3(b)(1), unless the authorization is terminated or revoked sooner.  Performed at [facility], [address] [place], [place] [address]   Blood Culture (routine x 2)     Status: None   Collection Time: 10/16/19  1:58 PM   Specimen: BLOOD  Result Value Ref Range Status   Specimen Description   Final    BLOOD RIGHT UPPER ARM Performed at [facility], [address], [place], [place] [address]    Special Requests   Final    BOTTLES DRAWN AEROBIC AND ANAEROBIC Blood Culture results may not be optimal due to an excessive volume of blood received in culture bottles Performed at [facility], [address], [place], [place] [address]    Culture   Final    NO GROWTH 5 DAYS Performed at [facility], [address] [place], [place] [address]    Report Status 10/21/2019 FINAL  Final  Blood Culture (routine x 2)     Status: None   Collection Time: 10/16/19  1:58 PM   Specimen: BLOOD  Result Value Ref Range Status   Specimen Description   Final    BLOOD RIGHT ANTECUBITAL Performed at [facility], [address], [place], [place] [address]    Special Requests   Final    BOTTLES DRAWN AEROBIC  AND ANAEROBIC Blood Culture adequate volume Performed at [facility], [address]  [place], [place] [address]    Culture   Final    NO GROWTH 5 DAYS Performed at [facility], [address] [place], [place] [address]    Report Status 10/21/2019 FINAL  Final     Labs: BNP (last 3 results) Recent Labs    10/16/19 1332  BNP 1,929.2*   Basic Metabolic Panel: Recent Labs  Lab 10/18/19 0453 10/19/19 0534 10/20/19 0605 10/21/19 0539 10/22/19 0451  NA 131* 128* 135 134* 135  K 3.6 4.2 4.5 4.5 4.6  CL 95* 98 103 105 104  CO2 24 23 23  21* 22  GLUCOSE 130* 97 97 86 113*  BUN 19 17 14 16 17   CREATININE 1.55* 1.20* 0.99 1.04* 1.04*  CALCIUM 8.0* 8.0* 8.5* 8.5* 8.5*  MG  --   --   --  1.9 1.7  PHOS  --   --   --  3.3  --    Liver Function Tests: Recent Labs  Lab 10/16/19 1332 10/20/19 0605  AST 62* 29  ALT 22 19  ALKPHOS 54 58  BILITOT 0.8 0.4  PROT 6.8 7.2  ALBUMIN 2.8* 2.8*   Recent Labs  Lab 10/16/19 1332  LIPASE 26   No results for input(s): AMMONIA in the last 168 hours. CBC: Recent Labs  Lab 10/16/19 1332 10/17/19 0630 10/21/19 0539 10/22/19 0451  WBC 5.3 4.3 4.2 4.2  NEUTROABS  --  2.5  --   --   HGB 12.6 12.6 13.2 12.7  HCT 39.4 39.5 42.2 41.2  MCV 81.6 83.9 84.4 83.9  PLT 173 152 173 164   Cardiac Enzymes: No results for input(s): CKTOTAL, CKMB, CKMBINDEX, TROPONINI in the last 168 hours. BNP: Invalid input(s): POCBNP CBG: No results for input(s): GLUCAP in the last 168 hours. D-Dimer No results for input(s): DDIMER in the last 72 hours. Hgb A1c No results for input(s): HGBA1C in the last 72 hours. Lipid Profile No results for input(s): CHOL, HDL, LDLCALC, TRIG, CHOLHDL, LDLDIRECT in the last 72 hours. Thyroid function studies No results for input(s): TSH, T4TOTAL, T3FREE, THYROIDAB in the last 72 hours.  Invalid input(s): FREET3 Anemia work up No results for input(s): VITAMINB12, FOLATE, FERRITIN, TIBC, IRON, RETICCTPCT in the last 72 hours. Urinalysis    Component Value Date/Time   COLORURINE STRAW (A)  10/16/2019 1728   APPEARANCEUR CLEAR 10/16/2019 1728   LABSPEC 1.009 10/16/2019 1728   PHURINE 6.0 10/16/2019 1728   GLUCOSEU NEGATIVE 10/16/2019 1728   HGBUR SMALL (A) 10/16/2019 1728   BILIRUBINUR NEGATIVE 10/16/2019 1728   KETONESUR NEGATIVE 10/16/2019 1728   PROTEINUR 100 (A) 10/16/2019 1728   NITRITE NEGATIVE 10/16/2019 1728   LEUKOCYTESUR NEGATIVE 10/16/2019 1728   Sepsis Labs Invalid input(s): PROCALCITONIN,  WBC,  LACTICIDVEN Microbiology Recent Results (from the past 240 hour(s))  SARS Coronavirus 2 by RT PCR (hospital order, performed in [facility] hospital lab) Nasopharyngeal Nasopharyngeal Swab     Status: None   Collection Time: 10/16/19  1:32 PM   Specimen: Nasopharyngeal Swab  Result Value Ref Range Status   SARS Coronavirus 2 NEGATIVE NEGATIVE Final    Comment: (NOTE) SARS-CoV-2 target nucleic acids are NOT DETECTED.  The SARS-CoV-2 RNA is generally detectable in upper and lower respiratory specimens during the acute phase of infection. The lowest concentration of SARS-CoV-2 viral copies this assay can detect is 250 copies / mL. A negative result  does not preclude SARS-CoV-2 infection and should not be used as the sole basis for treatment or other patient management decisions.  A negative result may occur with improper specimen collection / handling, submission of specimen other than nasopharyngeal swab, presence of viral mutation(s) within the areas targeted by this assay, and inadequate number of viral copies (<250 copies / mL). A negative result must be combined with clinical observations, patient history, and epidemiological information.  Fact Sheet for Patients:   BoilerBrush.com.cy  Fact Sheet for Healthcare Providers: https://pope.com/  This test is not yet approved or  cleared by the [place] FDA and has been authorized for detection and/or diagnosis of SARS-CoV-2 by FDA under an Emergency Use  Authorization (EUA).  This EUA will remain in effect (meaning this test can be used) for the duration of the COVID-19 declaration under Section 564(b)(1) of the Act, 21 U.S.C. section 360bbb-3(b)(1), unless the authorization is terminated or revoked sooner.  Performed at [facility], [address], [place], [place] [address]   Blood Culture (routine x 2)     Status: None   Collection Time: 10/16/19  1:58 PM   Specimen: BLOOD  Result Value Ref Range Status   Specimen Description   Final    BLOOD RIGHT UPPER ARM Performed at [facility], [address], [place], [place] [address]    Special Requests   Final    BOTTLES DRAWN AEROBIC AND ANAEROBIC Blood Culture results may not be optimal due to an excessive volume of blood received in culture bottles Performed at [facility], [address], [place], [place] [address]    Culture   Final    NO GROWTH 5 DAYS Performed at [facility], [address], [place], [place] [address]    Report Status 10/21/2019 FINAL  Final  Blood Culture (routine x 2)     Status: None   Collection Time: 10/16/19  1:58 PM   Specimen: BLOOD  Result Value Ref Range Status   Specimen Description   Final    BLOOD RIGHT ANTECUBITAL Performed at [facility], [address], [place], [place] [address]    Special Requests   Final    BOTTLES DRAWN AEROBIC AND ANAEROBIC Blood Culture adequate volume Performed at [facility], [address] [place], [place] [address]    Culture   Final    NO GROWTH 5 DAYS Performed at [facility], [address] [place], [place] [address]    Report Status 10/21/2019 FINAL  Final     Time coordinating discharge: Over 39 minutes  SIGNED:   Alwyn Ren, MD  [facility] 10/22/2019, 11:06 AM

## 2019-10-22 NOTE — Telephone Encounter (Signed)
Pt is scheduled for a TOC appointment 10.06.21 at 2:15 with Rhonda Barrett. The pt is followed by Dr. Salena Saner but no available APPs on Dr. Renaye Rakers Care team.  The appt was scheduled with Blima Rich
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
## 2019-10-22 NOTE — Progress Notes (Signed)
Pt discharged to home/self care.  Follow up appointments and prescriptions provided to pt.  Pt provided with aspirin 81mg , losartan 25mg ,& Symtuza at discharge from hospital.  Transportation for pt arranged through of .  And pt was also provided with bus pass.  Pt verbalized to RN understanding of discharge instructions. Esias Mory, [employer], RN

## 2019-10-23 NOTE — Telephone Encounter (Signed)
Call attempt x2 - line busy 

## 2019-10-24 NOTE — Telephone Encounter (Signed)
Line busy

## 2019-10-25 NOTE — Telephone Encounter (Signed)
Attempted to call patient - line busy x2 attempts

## 2019-10-26 NOTE — Telephone Encounter (Signed)
Attempt to call-line busy.    Will close encounter

## 2019-10-30 LAB — HIV GENOSURE(R) MG

## 2019-10-30 LAB — HIV-1 RNA, PCR (GRAPH) RFX/GENO EDI
HIV-1 RNA BY PCR: 4910 copies/mL
HIV-1 RNA Quant, Log: 3.691 log10copy/mL

## 2019-10-30 LAB — REFLEX TO GENOSURE(R) MG EDI: HIV GenoSure(R): 1

## 2019-11-06 NOTE — Progress Notes (Deleted)
Cardiology Office Note   Date:  11/06/2019   ID:  Alyssa Barnett, DOB Aug 24, 1975, MRN [id number]  PCP:  Patient, No Pcp Per Cardiologist:  Thurmon Fair, MD 10/19/2019 Electrphysiologist: None [name], PA-C   No chief complaint on file.   History of Present Illness: Alyssa Barnett is a 44 y.o. female with a history of homelessness, FH CAD in uncle, untreated HIV, polysubs abuse  Admitted 09/14-09/20/2021 w/ CP/ SOB, UDS +cocaine, EF 25-30%, Amyloid labs with elevated Kappa and Lamda free light chains with a ratio of 1.27, consider PYP scan as outpt. May start BB as outpt if pt clean, outpt ischemic eval, rx for HIV started, AKI resolved w/ hydration.  [facility] presents for ***  COVID status: un-vaccinated, had/did not have COVID Past Medical History:  Diagnosis Date  . HIV (human immunodeficiency virus infection) (HCC)     No past surgical history on file.  Current Outpatient Medications  Medication Sig Dispense Refill  . aspirin EC 81 MG EC tablet Take 1 tablet (81 mg total) by mouth daily. Swallow whole. 30 tablet 11  . Darunavir-Cobicisctat-Emtricitabine-Tenofovir Alafenamide (SYMTUZA) 800-150-200-10 MG TABS Take 1 tablet by mouth daily with breakfast. 30 tablet 0  . losartan (COZAAR) 25 MG tablet Take 1 tablet (25 mg total) by mouth daily. 30 tablet 4  . nicotine (NICODERM CQ - DOSED IN MG/24 HOURS) 14 mg/24hr patch Place 1 patch (14 mg total) onto the skin daily. 28 patch 0   No current facility-administered medications for this visit.    Allergies:   Flagyl [metronidazole] and Cefaclor    Social History:  The patient  reports that she has been smoking. She has never used smokeless tobacco. She reports current alcohol use. She reports current drug use. Drugs: Marijuana and Cocaine.   Family History:  The patient's family history includes CAD in her father; Diabetes in her father.  She indicated that the status of her father is  unknown.    ROS:  Please see the history of present illness. All other systems are reviewed and negative.    PHYSICAL EXAM: VS:  There were no vitals taken for this visit. , BMI There is no height or weight on file to calculate BMI. GEN: Well nourished, well developed, female in no acute distress HEENT: normal for age  Neck: no JVD, no carotid bruit, no masses Cardiac: RRR; no murmur, no rubs, or gallops Respiratory:  clear to auscultation bilaterally, normal work of breathing GI: soft, nontender, nondistended, + BS MS: no deformity or atrophy; no edema; distal pulses are 2+ in all 4 extremities  Skin: warm and dry, no rash Neuro:  Strength and sensation are intact Psych: euthymic mood, full affect   EKG:  EKG {ACTION; IS/IS FTD:32202542} ordered today. The ekg ordered today demonstrates ***  ECHO: 10/17/19: 1. Left ventricular ejection fraction, by estimation, is 25 to 30%. The  left ventricle has severely decreased function. The left ventricle  demonstrates global hypokinesis. The left ventricular internal cavity size  was mildly dilated. Left ventricular  diastolic parameters are consistent with Grade II diastolic dysfunction  (pseudonormalization).  2. Right ventricular systolic function is moderately reduced. The right  ventricular size is mildly enlarged. Mildly increased right ventricular  wall thickness.  3. Left atrial size was mildly dilated.  4. Right atrial size was mildly dilated.  5. A small pericardial effusion is present.  6. The mitral valve is grossly normal. Mild mitral valve regurgitation.  No evidence  of mitral stenosis.  7. The aortic valve is grossly normal. Aortic valve regurgitation is not  visualized. No aortic stenosis is present.   Recent Labs: 10/16/2019: B Natriuretic Peptide 1,929.2 10/20/2019: ALT 19 10/22/2019: BUN 17; Creatinine, Ser 1.04; Hemoglobin 12.7; Magnesium 1.7; Platelets 164; Potassium 4.6; Sodium 135  CBC    Component  Value Date/Time   WBC 4.2 10/22/2019 0451   RBC 4.91 10/22/2019 0451   HGB 12.7 10/22/2019 0451   HCT 41.2 10/22/2019 0451   PLT 164 10/22/2019 0451   MCV 83.9 10/22/2019 0451   MCH 25.9 (L) 10/22/2019 0451   MCHC 30.8 10/22/2019 0451   RDW 14.0 10/22/2019 0451   LYMPHSABS 1.6 10/17/2019 0630   MONOABS 0.2 10/17/2019 0630   EOSABS 0.0 10/17/2019 0630   BASOSABS 0.1 10/17/2019 0630   CMP Latest Ref Rng & Units 10/22/2019 10/21/2019 10/20/2019  Glucose 70 - 99 mg/dL 657(Q) 86 97  BUN 6 - 20 mg/dL 17 16 14   Creatinine 0.44 - 1.00 mg/dL ) 4.69(G) 2.95(M  Sodium 135 - 145 mmol/L 135 134(L) 135  Potassium 3.5 - 5.1 mmol/L 4.6 4.5 4.5  Chloride 98 - 111 mmol/L 104 105 103  CO2 22 - 32 mmol/L 22 21(L) 23  Calcium 8.9 - 10.3 mg/dL 8.41) 3.2(G) 4.0(N)  Total Protein 6.5 - 8.1 g/dL - - 7.2  Total Bilirubin 0.3 - 1.2 mg/dL - - 0.4  Alkaline Phos 38 - 126 U/L - - 58  AST 15 - 41 U/L - - 29  ALT 0 - 44 U/L - - 19     Lipid Panel Lab Results  Component Value Date   CHOL 99 10/17/2019   HDL 20 (L) 10/17/2019   LDLCALC 59 10/17/2019   TRIG 100 10/17/2019   CHOLHDL 5.0 10/17/2019      Wt Readings from Last 3 Encounters:  10/22/19 122 lb 9.2 oz (55.6 kg)  10/11/19 125 lb (56.7 kg)  10/01/19 125 lb (56.7 kg)     Other studies Reviewed: Additional studies/ records that were reviewed today include: Office notes, hospital records and testing.  ASSESSMENT AND PLAN:  1.  ***   Current medicines are reviewed at length with the patient today.  The patient {ACTIONS; HAS/DOES NOT HAVE:19233} concerns regarding medicines.  The following changes have been made:  {PLAN; NO CHANGE:13088:s}  Labs/ tests ordered today include:  No orders of the defined types were placed in this encounter.    Disposition:   FU with 10/03/19, MD  Signed, Thurmon Fair, PA-C  11/06/2019 2:01 PM    [facility] Phone: [phone]; Fax: [phone]

## 2019-11-07 ENCOUNTER — Ambulatory Visit: Payer: Medicaid Other | Admitting: Physician Assistant

## 2019-11-13 ENCOUNTER — Telehealth (HOSPITAL_COMMUNITY): Payer: Self-pay | Admitting: Licensed Clinical Social Worker

## 2019-11-13 ENCOUNTER — Telehealth: Payer: Self-pay | Admitting: Cardiovascular Disease

## 2019-11-13 MED ORDER — FUROSEMIDE 40 MG PO TABS: 40.0000 mg | ORAL_TABLET | Freq: Every day | ORAL | 0 refills | Status: DC

## 2019-11-13 NOTE — Telephone Encounter (Signed)
Spoke with patient who reports feeling like she did while she was in the hospital with fluid in her chest.  She does not have a way to wt herself.  Does report a productive cough that is not clear in color but hard to describe.  She reports swelling at her feet and ankles bilaterally.  She is asking for something to be called into [facility] at [facility] to help with this.  Advised I will have MD/APP to review and she will be called back with further instructions.  She is also asking for an earlier appt if one becomes available.  Presently scheduled with Edd Fabian 11/18.  Also, of note, she states we can speak with her father about any of her care necessary.

## 2019-11-13 NOTE — Telephone Encounter (Signed)
Patient calling stating she believes she has fluid around her heart and would like the medication she was given in the hospital to remove it. She states she does not know the name of the medication. She states to call [phone], but if that phone is off call her father Caryl Pina [phone].

## 2019-11-13 NOTE — Telephone Encounter (Signed)
Please prescribe furosemide 40 mg once daily, #45 no refills until she has an office appointment. If she is taking the losartan prescription, can hold off taking K supplement.

## 2019-11-13 NOTE — Telephone Encounter (Signed)
Attempted to contact pt at [phone] but received a recording that # has been restricted.  LM on [phone] to c/b to discuss he concerns.  Pt was in the hospital 10/22/2019 with acute onset combined CHF with EF 25-30% on echo.  Appears she received Furosemide 40 mg X 1 but it was d/ced d/t AKI - crea 1.04.  She was not discharged with any furosemide.

## 2019-11-13 NOTE — Telephone Encounter (Signed)
CSW consulted to speak with pt regarding concerns with transportation, housing, and other social/medical concerns.  CSW attempted to call but went straight to error message- unable to leave VM.  Understand pt depends on wifi to make calls so CSW also sent text message  Will continue to attempt to make contact  Burna Sis, LCSW Clinical Social Worker [facility] Desk#: [phone] Cell#: [phone]

## 2019-11-13 NOTE — Telephone Encounter (Signed)
Unable to reach pt by phone - called and spoke with father.  Advised to make pt aware RX has been sent into [facility] as requested however she will need to keep her f/u appt for further refills.  Also advised father I sent information to our social worker Annice Pih, to see if there are any resources available to her.  He states understanding and will notify pt when he speaks to her.

## 2019-11-13 NOTE — Telephone Encounter (Signed)
Received call back from pt.  She reports she has to be able to use WiFi to make calls and she missed her appt d/t lack of transportation.  This RN started to ask pt about the symptoms she called in with however the call was dropped.  Attempted to call pt back and received the same message that the phone has been restricted.

## 2019-11-13 NOTE — Telephone Encounter (Signed)
Spoke with pt's father Alyssa Barnett who called back after I left a message on his voicemail as requested since I was not able to contact pt by phone.  He reports he does not know where she is and the only way he his to reach her is at the [phone] #.  Advised when I call that # I receive a message stating that # is restricted.  Father will attempt to contact pt and have her c/b.  Father was asking about pt's condition.  Advised since I do not have signed paperwork stating I can disclose that information on file in her chart, I am unfortunately unable to discuss that with him.  He stated pt doesn't always tell him where she is and that her real last name is Alyssa Barnett not Chad.

## 2019-12-07 ENCOUNTER — Telehealth: Payer: Self-pay | Admitting: Licensed Clinical Social Worker

## 2019-12-07 NOTE — Telephone Encounter (Signed)
CSW attempted to reach pt again to see if we can assist her/schedule transportation for her upcoming appointment. Called her number at [phone]. Phone still "restricted" and unable to leave a message at this time. Attempted to reach her fathers number which she had given permission to call at [phone]. Unfortunately this number is also now out of service. Will re-attempt pt closer to appointment.   Octavio Graves, MSW, LCSW [facility] Clinical Social Work

## 2019-12-10 ENCOUNTER — Telehealth: Payer: Self-pay | Admitting: Licensed Clinical Social Worker

## 2019-12-10 NOTE — Telephone Encounter (Signed)
Was able to leave HIPAA compliant message on voicemail today for pt father Caryl Pina. Number pt had provided was [phone]. Attempting to see if pt has another contact number to ensure that she is aware of her upcoming appointment/assist with transportation and other resources.  Octavio Graves, MSW, LCSW [facility] Clinical Social Work
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
## 2019-12-19 ENCOUNTER — Emergency Department (HOSPITAL_COMMUNITY)
Admission: EM | Admit: 2019-12-19 | Discharge: 2019-12-19 | Disposition: A | Discharge status: Home / Self Care | Payer: Medicaid Other | Attending: Emergency Medicine | Admitting: Emergency Medicine

## 2019-12-19 ENCOUNTER — Emergency Department (HOSPITAL_COMMUNITY): Payer: Medicaid Other

## 2019-12-19 ENCOUNTER — Other Ambulatory Visit: Payer: Self-pay

## 2019-12-19 ENCOUNTER — Encounter (HOSPITAL_COMMUNITY): Payer: Self-pay

## 2019-12-19 DIAGNOSIS — Z7982 Long term (current) use of aspirin: Secondary | ICD-10-CM | POA: Insufficient documentation

## 2019-12-19 DIAGNOSIS — I110 Hypertensive heart disease with heart failure: Secondary | ICD-10-CM | POA: Insufficient documentation

## 2019-12-19 DIAGNOSIS — F17200 Nicotine dependence, unspecified, uncomplicated: Secondary | ICD-10-CM | POA: Insufficient documentation

## 2019-12-19 DIAGNOSIS — Z20822 Contact with and (suspected) exposure to covid-19: Secondary | ICD-10-CM | POA: Diagnosis not present

## 2019-12-19 DIAGNOSIS — Z79899 Other long term (current) drug therapy: Secondary | ICD-10-CM | POA: Diagnosis not present

## 2019-12-19 DIAGNOSIS — I509 Heart failure, unspecified: Secondary | ICD-10-CM | POA: Insufficient documentation

## 2019-12-19 DIAGNOSIS — R0602 Shortness of breath: Secondary | ICD-10-CM | POA: Diagnosis present

## 2019-12-19 HISTORY — DX: Other psychoactive substance use, unspecified, uncomplicated: F19.90

## 2019-12-19 HISTORY — DX: Post-traumatic stress disorder, unspecified: F43.10

## 2019-12-19 HISTORY — DX: Unspecified viral hepatitis B without hepatic coma: B19.10

## 2019-12-19 HISTORY — DX: Other specified bacterial agents as the cause of diseases classified elsewhere: B96.89

## 2019-12-19 HISTORY — DX: Essential (primary) hypertension: I10

## 2019-12-19 HISTORY — DX: Attention-deficit hyperactivity disorder, unspecified type: F90.9

## 2019-12-19 HISTORY — DX: Pneumonia, unspecified organism: J18.9

## 2019-12-19 LAB — CBC
HCT: 40.7 % (ref 36.0–46.0)
Hemoglobin: 12.7 g/dL (ref 12.0–15.0)
MCH: 25.4 pg — ABNORMAL LOW (ref 26.0–34.0)
MCHC: 31.2 g/dL (ref 30.0–36.0)
MCV: 81.4 fL (ref 80.0–100.0)
Platelets: 161 10*3/uL (ref 150–400)
RBC: 5 MIL/uL (ref 3.87–5.11)
RDW: 17.3 % — ABNORMAL HIGH (ref 11.5–15.5)
WBC: 7.1 10*3/uL (ref 4.0–10.5)
nRBC: 0 % (ref 0.0–0.2)

## 2019-12-19 LAB — BASIC METABOLIC PANEL
Anion gap: 6 (ref 5–15)
BUN: 11 mg/dL (ref 6–20)
CO2: 25 mmol/L (ref 22–32)
Calcium: 8 mg/dL — ABNORMAL LOW (ref 8.9–10.3)
Chloride: 103 mmol/L (ref 98–111)
Creatinine, Ser: 0.97 mg/dL (ref 0.44–1.00)
GFR, Estimated: >60 mL/min (ref >60)
Glucose, Bld: 101 mg/dL — ABNORMAL HIGH (ref 70–99)
Potassium: 3.4 mmol/L — ABNORMAL LOW (ref 3.5–5.1)
Sodium: 134 mmol/L — ABNORMAL LOW (ref 135–145)

## 2019-12-19 LAB — TROPONIN I (HIGH SENSITIVITY)
Troponin I (High Sensitivity): 41 ng/L — ABNORMAL HIGH (ref <18)
Troponin I (High Sensitivity): 42 ng/L — ABNORMAL HIGH (ref <18)

## 2019-12-19 LAB — I-STAT BETA HCG BLOOD, ED (MC, WL, AP ONLY): I-stat hCG, quantitative: <5 m[IU]/mL (ref <5)

## 2019-12-19 LAB — RESPIRATORY PANEL BY RT PCR (FLU A&B, COVID)
Influenza A by PCR: NEGATIVE
Influenza B by PCR: NEGATIVE
SARS Coronavirus 2 by RT PCR: NEGATIVE

## 2019-12-19 LAB — BRAIN NATRIURETIC PEPTIDE: B Natriuretic Peptide: 1091.9 pg/mL — ABNORMAL HIGH (ref 0.0–100.0)

## 2019-12-19 MED ORDER — FUROSEMIDE 10 MG/ML IJ SOLN
40.0000 mg | Freq: Once | INTRAMUSCULAR | Status: AC
  Administered 2019-12-19: 40 mg via INTRAVENOUS
  Filled 2019-12-19: qty 4

## 2019-12-19 NOTE — ED Triage Notes (Signed)
Pt is coming from a hotel and dx here with CHF, pt complains of pain all over and refused to answer anymore questions from EMS Pt tells them that she hasn't taken any of her medicines in awhile and she doesn't know what they are

## 2019-12-19 NOTE — ED Notes (Signed)
Pt very agitated yelling at staff members refusing to answer any questions or comply with anything being asked of her.

## 2019-12-19 NOTE — ED Provider Notes (Signed)
[facility]-EMERGENCY DEPT Provider Note   CSN: [id number] Arrival date & time: 12/19/19  0156     History No chief complaint on file.   Alyssa Barnett is a 44 y.o. female.  Patient to ED with complaint of generalized body aches, cough, SOB, LE swelling, "like when I was admitted with CHF." She has also had recent pneumonia and states it feels it could be this as well. No vomiting but she reports nausea. No diarrhea. She has not been taking any of her regular medications, including her lasix because she hasn't been able to get to the pharmacy. No significant congestion.   The history is provided by the patient. No language interpreter was used.       Past Medical History:  Diagnosis Date  . ADHD   . Bacterial vaginosis   . Hepatitis B   . HIV (human immunodeficiency virus infection) (HCC)   . Hypertension   . Pneumonia   . PTSD (post-traumatic stress disorder)   . Substance use disorder     Patient Active Problem List   Diagnosis Date Noted  . Fall   . Heart failure, left, with LVEF <=30% (HCC)   . Acquired immunodeficiency syndrome (AIDS) with bacterial pneumonia (HCC) 10/16/2019  . New onset of congestive heart failure (HCC) 10/16/2019  . Polysubstance abuse (HCC) 10/16/2019  . Tobacco dependence 10/16/2019    History reviewed. No pertinent surgical history.   OB History   No obstetric history on file.     Family History  Problem Relation Age of Onset  . Diabetes Father   . CAD Father     Social History   Tobacco Use  . Smoking status: Current Every Day Smoker  . Smokeless tobacco: Never Used  Vaping Use  . Vaping Use: Never used  Substance Use Topics  . Alcohol use: Yes  . Drug use: Yes    Types: Marijuana, Cocaine    Comment: crack     Home Medications Prior to Admission medications   Medication Sig Start Date End Date Taking? Authorizing Provider  aspirin EC 81 MG EC tablet Take 1 tablet (81 mg total) by mouth  daily. Swallow whole. 10/23/19   Alwyn Ren, MD  Darunavir-Cobicisctat-Emtricitabine-Tenofovir Alafenamide [facility]) 800-150-200-10 MG TABS Take 1 tablet by mouth daily with breakfast. 10/18/19   Tyrone Nine, MD  furosemide (LASIX) 40 MG tablet Take 1 tablet (40 mg total) by mouth daily. 11/13/19   Croitoru, Mihai, MD  losartan (COZAAR) 25 MG tablet Take 1 tablet (25 mg total) by mouth daily. 10/23/19   Alwyn Ren, MD  nicotine (NICODERM CQ - DOSED IN MG/24 HOURS) 14 mg/24hr patch Place 1 patch (14 mg total) onto the skin daily. 10/23/19   Alwyn Ren, MD    Allergies    Flagyl [metronidazole] and Cefaclor  Review of Systems   Review of Systems  Constitutional: Negative for chills and fever.  HENT: Negative.  Negative for congestion and sore throat.   Respiratory: Positive for cough and shortness of breath.   Cardiovascular: Positive for chest pain and leg swelling.  Gastrointestinal: Positive for nausea. Negative for abdominal pain, diarrhea and vomiting.  Musculoskeletal: Positive for myalgias.  Skin: Negative.   Neurological: Negative.     Physical Exam Updated Vital Signs BP (!) 154/107 (BP Location: Left Arm)   Pulse 100   Temp 97.6 F (36.4 C)   Resp 20   Ht 5\' 4"  (1.626 m)   SpO2 100%  BMI 21.04 kg/m   Physical Exam Vitals and nursing note reviewed.  Constitutional:      General: She is not in acute distress.    Appearance: She is well-developed. She is not ill-appearing.  HENT:     Head: Normocephalic.     Mouth/Throat:     Mouth: Mucous membranes are moist.  Eyes:     Conjunctiva/sclera: Conjunctivae normal.  Cardiovascular:     Rate and Rhythm: Normal rate and regular rhythm.     Heart sounds: No murmur heard.   Pulmonary:     Effort: Pulmonary effort is normal.     Breath sounds: Rales (left LL.) present. No wheezing or rhonchi.  Abdominal:     General: Bowel sounds are normal.     Palpations: Abdomen is soft.      Tenderness: There is no abdominal tenderness. There is no guarding or rebound.  Musculoskeletal:        General: Normal range of motion.     Cervical back: Normal range of motion and neck supple.     Right lower leg: No edema.     Left lower leg: No edema.  Skin:    General: Skin is warm and dry.     Findings: No rash.  Neurological:     General: No focal deficit present.     Mental Status: She is alert and oriented to person, place, and time.     ED Results / Procedures / Treatments   Labs (all labs ordered are listed, but only abnormal results are displayed) Labs Reviewed  BASIC METABOLIC PANEL  CBC  BRAIN NATRIURETIC PEPTIDE  I-STAT BETA HCG BLOOD, ED ([facility], [facility], [facility] ONLY)  TROPONIN I (HIGH SENSITIVITY)    EKG EKG Interpretation  Date/Time:  Wednesday December 19 2019 02:26:36 EST Ventricular Rate:  96 PR Interval:    QRS Duration: 93 QT Interval:  369 QTC Calculation: 467 R Axis:   111 Text Interpretation: Sinus rhythm LAE, consider biatrial enlargement Right axis deviation Consider left ventricular hypertrophy When compared with ECG of 10/16/2019, No significant change was found Confirmed by Dione Booze ([id number]) on 12/19/2019 2:40:25 AM   Radiology No results found.  Procedures Procedures (including critical care time)  Medications Ordered in ED Medications - No data to display  ED Course  I have reviewed the triage vital signs and the nursing notes.  Pertinent labs & imaging results that were available during my care of the patient were reviewed by me and considered in my medical decision making (see chart for details).    MDM Rules/Calculators/A&P                          Patient with history of CHF, recent CAP, cocaine abuse presents with ss/sxs as per HPI.   There is no fever. No leukocytosis. CXR abnormal and given clinical picture favor edema over PNA. IV lasix given. Will reassess after diuresis. COVID pending.  COVID, delta trop without up  trending, patient diuresing well, no hypoxia on ambulation. She is felt appropriate for discharge. She has a follow up appointment later this morning that she states she knows about.   Final Clinical Impression(s) / ED Diagnoses Final diagnoses:  None   1. CHF  Rx / DC Orders ED Discharge Orders    None       Elpidio Anis, Cordelia Poche 12/19/19 7106    Dione Booze, MD 12/19/19 [phone]

## 2019-12-19 NOTE — ED Notes (Signed)
Pt ambulated to BR without assistance, given sandwich and juice.

## 2019-12-19 NOTE — ED Notes (Signed)
RN was trying to talk to patient about an upcoming appt and she starts to raise her voice and states that she hopes she's in here, RN also tried to talk to her about picking up her prescription at [facility] and she again started to yell and say noone ever called her about anything, Rn was going to show her where the notes were written about the phone calls made and she continued to yell at [employer]

## 2019-12-19 NOTE — Discharge Instructions (Addendum)
Please plan to keep your scheduled appointment later this morning where you can discuss needed medication prescriptions and further care.

## 2019-12-19 NOTE — ED Notes (Signed)
Pt ambulated to bathroom without assistance 

## 2019-12-19 NOTE — ED Notes (Signed)
Pt tells RN that she wants to be put in a home because she has noone to help her and she's dying anyway

## 2019-12-19 NOTE — ED Notes (Signed)
This RN sent down a rainbow with blood draw including an extra blue, gold and light green tube
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
## 2019-12-19 NOTE — ED Notes (Signed)
Pt ambulated in hallway approximately 172ft, oxygen remained between 94-97% while ambulating and patient in no acute distress

## 2019-12-19 NOTE — ED Notes (Signed)
Pt spoke with her father and she states that she never got the message about the medicine.

## 2019-12-20 ENCOUNTER — Ambulatory Visit: Payer: Medicaid Other | Admitting: General Practice

## 2020-01-08 ENCOUNTER — Telehealth: Payer: Self-pay | Admitting: Cardiovascular Disease

## 2020-01-08 ENCOUNTER — Telehealth: Payer: Self-pay | Admitting: Licensed Clinical Social Worker

## 2020-01-08 NOTE — Telephone Encounter (Signed)
Alyssa Barnett is calling requesting to speak with a Child psychotherapist about arranging transportation before scheduling an appointment. Please advise.
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
## 2020-01-08 NOTE — Telephone Encounter (Signed)
CSW received referral for transportation for pt in order for her to reschedule her appointments here. This CSW had previously attempted as had [place], CSW from [facility], to reach pt w/ no success. Was able to leave HIPAA compliant voice message on [phone].   Octavio Graves, MSW, LCSW [facility]Vascular Care Navigation  [phone]

## 2020-01-09 ENCOUNTER — Telehealth: Payer: Self-pay | Admitting: Licensed Clinical Social Worker

## 2020-01-09 NOTE — Telephone Encounter (Signed)
CSW called pt phone again to go over resources/VISPDAT w/ pt.   No answer, HIPAA compliant message left for pt at [phone].  Octavio Graves, MSW, LCSW [facility] Heart/Vascular Care Navigation  [phone]

## 2020-01-15 ENCOUNTER — Telehealth: Payer: Self-pay | Admitting: Licensed Clinical Social Worker

## 2020-01-15 NOTE — Telephone Encounter (Signed)
CSW has still been unsuccessful in reaching pt again as have transportation services. CSW attempted to reach pt via text this time at [phone]. CSW provided my name and the contact information to pt for Transportation Services encouraging her to get in touch with them so we can get her to her appointments and to the clinic to begin some assistance applications.   Octavio Graves, MSW, LCSW [facility]/Vascular Care Navigation  [phone]

## 2020-01-21 ENCOUNTER — Telehealth: Payer: Self-pay | Admitting: Licensed Clinical Social Worker

## 2020-01-21 NOTE — Telephone Encounter (Signed)
Additional text offering connection to assistance programs sent to pt at the number I was able to reach her at previously, [phone].   Octavio Graves, MSW, LCSW [facility] Heart/Vascular Care Navigation  [phone]

## 2020-10-04 ENCOUNTER — Emergency Department (HOSPITAL_COMMUNITY)
Admission: EM | Admit: 2020-10-04 | Discharge: 2020-10-04 | Disposition: A | Discharge status: Home / Self Care | Payer: Medicaid Other | Attending: Emergency Medicine | Admitting: Emergency Medicine

## 2020-10-04 ENCOUNTER — Other Ambulatory Visit: Payer: Self-pay

## 2020-10-04 ENCOUNTER — Encounter (HOSPITAL_COMMUNITY): Payer: Self-pay | Admitting: Emergency Medicine

## 2020-10-04 ENCOUNTER — Emergency Department (HOSPITAL_COMMUNITY): Payer: Medicaid Other

## 2020-10-04 DIAGNOSIS — Z7982 Long term (current) use of aspirin: Secondary | ICD-10-CM | POA: Insufficient documentation

## 2020-10-04 DIAGNOSIS — I10 Essential (primary) hypertension: Secondary | ICD-10-CM | POA: Insufficient documentation

## 2020-10-04 DIAGNOSIS — Z79899 Other long term (current) drug therapy: Secondary | ICD-10-CM | POA: Insufficient documentation

## 2020-10-04 DIAGNOSIS — R52 Pain, unspecified: Secondary | ICD-10-CM

## 2020-10-04 DIAGNOSIS — Z21 Asymptomatic human immunodeficiency virus [HIV] infection status: Secondary | ICD-10-CM | POA: Insufficient documentation

## 2020-10-04 DIAGNOSIS — R0602 Shortness of breath: Secondary | ICD-10-CM | POA: Diagnosis not present

## 2020-10-04 DIAGNOSIS — R5383 Other fatigue: Secondary | ICD-10-CM | POA: Diagnosis present

## 2020-10-04 DIAGNOSIS — Z20822 Contact with and (suspected) exposure to covid-19: Secondary | ICD-10-CM | POA: Insufficient documentation

## 2020-10-04 DIAGNOSIS — R079 Chest pain, unspecified: Secondary | ICD-10-CM | POA: Insufficient documentation

## 2020-10-04 DIAGNOSIS — F17200 Nicotine dependence, unspecified, uncomplicated: Secondary | ICD-10-CM | POA: Diagnosis not present

## 2020-10-04 LAB — CBC WITH DIFFERENTIAL/PLATELET
Abs Immature Granulocytes: 0.01 10*3/uL (ref 0.00–0.07)
Basophils Absolute: 0 10*3/uL (ref 0.0–0.1)
Basophils Relative: 0 %
Eosinophils Absolute: 0 10*3/uL (ref 0.0–0.5)
Eosinophils Relative: 0 %
HCT: 38.5 % (ref 36.0–46.0)
Hemoglobin: 12.7 g/dL (ref 12.0–15.0)
Immature Granulocytes: 0 %
Lymphocytes Relative: 23 %
Lymphs Abs: 1.1 10*3/uL (ref 0.7–4.0)
MCH: 27.7 pg (ref 26.0–34.0)
MCHC: 33 g/dL (ref 30.0–36.0)
MCV: 84.1 fL (ref 80.0–100.0)
Monocytes Absolute: 0.6 10*3/uL (ref 0.1–1.0)
Monocytes Relative: 12 %
Neutro Abs: 2.9 10*3/uL (ref 1.7–7.7)
Neutrophils Relative %: 65 %
Platelets: 107 10*3/uL — ABNORMAL LOW (ref 150–400)
RBC: 4.58 MIL/uL (ref 3.87–5.11)
RDW: 14.8 % (ref 11.5–15.5)
WBC: 4.6 10*3/uL (ref 4.0–10.5)
nRBC: 0 % (ref 0.0–0.2)

## 2020-10-04 LAB — COMPREHENSIVE METABOLIC PANEL
ALT: 23 U/L (ref 0–44)
AST: 36 U/L (ref 15–41)
Albumin: 2.9 g/dL — ABNORMAL LOW (ref 3.5–5.0)
Alkaline Phosphatase: 53 U/L (ref 38–126)
Anion gap: 5 (ref 5–15)
BUN: 18 mg/dL (ref 6–20)
CO2: 24 mmol/L (ref 22–32)
Calcium: 9.1 mg/dL (ref 8.9–10.3)
Chloride: 106 mmol/L (ref 98–111)
Creatinine, Ser: 1.28 mg/dL — ABNORMAL HIGH (ref 0.44–1.00)
GFR, Estimated: 53 mL/min — ABNORMAL LOW (ref >60)
Glucose, Bld: 90 mg/dL (ref 70–99)
Potassium: 4.8 mmol/L (ref 3.5–5.1)
Sodium: 135 mmol/L (ref 135–145)
Total Bilirubin: 0.7 mg/dL (ref 0.3–1.2)
Total Protein: 7.9 g/dL (ref 6.5–8.1)

## 2020-10-04 LAB — RESP PANEL BY RT-PCR (FLU A&B, COVID) ARPGX2
Influenza A by PCR: NEGATIVE
Influenza B by PCR: NEGATIVE
SARS Coronavirus 2 by RT PCR: NEGATIVE

## 2020-10-04 LAB — BRAIN NATRIURETIC PEPTIDE: B Natriuretic Peptide: 544.5 pg/mL — ABNORMAL HIGH (ref 0.0–100.0)

## 2020-10-04 LAB — TROPONIN I (HIGH SENSITIVITY)
Troponin I (High Sensitivity): 37 ng/L — ABNORMAL HIGH (ref <18)
Troponin I (High Sensitivity): 35 ng/L — ABNORMAL HIGH (ref <18)

## 2020-10-04 LAB — LACTIC ACID, PLASMA: Lactic Acid, Venous: 1.2 mmol/L (ref 0.5–1.9)

## 2020-10-04 MED ORDER — ONDANSETRON 4 MG PO TBDP
4.0000 mg | ORAL_TABLET | Freq: Once | ORAL | Status: AC
  Administered 2020-10-04: 4 mg via ORAL
  Filled 2020-10-04: qty 1

## 2020-10-04 NOTE — ED Provider Notes (Signed)
Emergency Medicine Provider Triage Evaluation Note  Alyssa Barnett , a 45 y.o. female  was evaluated in triage.  Pt complains of worsening fatigue after being discharged from [facility] yesterday. States she was admitted for 1 week for heart failure exacerbation. States that she has been having chest pain, shortness of breath, and 'kidney pain' since last week.   Review of Systems  Positive: Fever, chills, fatigue, chest pain, shortness of breath, nausea Negative: Hematuria, vomiting, diarrhea  Physical Exam  BP (!) 118/58 (BP Location: Right Arm)   Pulse 89   Temp 98.3 F (36.8 C) (Oral)   Resp 16   SpO2 98%  Gen:   Awake, patient laying on floor in triage Resp:  Normal effort MSK:   Moves extremities without difficulty Other:  No CVA tenderness, abdomen soft and nontender. Heart regular rate and rhythm. Cranial nerves grossly in tact  Medical Decision Making  Medically screening exam initiated at 12:26 PM.  Appropriate orders placed.  Alyssa Barnett was informed that the remainder of the evaluation will be completed by another provider, this initial triage assessment does not replace that evaluation, and the importance of remaining in the ED until their evaluation is complete.    Vear Clock 10/04/20 1231    Cheryll Cockayne, MD 10/06/20 [phone]

## 2020-10-04 NOTE — ED Provider Notes (Signed)
[facility] EMERGENCY DEPARTMENT Provider Note   CSN: [id number] Arrival date & time: 10/04/20  1207     History Chief Complaint  Patient presents with   Fatigue    Aleezah Gwathney is a 45 y.o. female with history of polysubstance abuse and heart failure who presents to the emergency department today for further evaluation of chest pain shortness of breath that began yesterday.  She states that she was just discharged from [facility] after staying a week for a heart failure exacerbation and acute kidney injury.  She states that she felt better when she was discharged but later on that day began feeling worse again.  She is complaining of constant chest pain with associated shortness of breath is worse with exertion, orthopnea, subjective fever, fatigue, myalgias, and leg swelling.  Denies any abdominal pain, nausea, vomiting, diarrhea.  She last used cocaine prior to admission at [facility].  Has not had any alcohol in a long time.  She denies any IV drug abuse.  She states she has been taking all of her medications since discharge.  At this time she is unable to tell me which ones they are. She is currently homeless.   Old records were reviewed.  BNP has improved from 630 since discharge from [facility].  Her most recent echo on 31 August revealed a ejection fraction of 20-25%. [place]DS [place] was positive for cocaine and marijuana.  Hepatitis A and B were both reactive.  She tested negative for COVID.  The history is provided by the patient. No language interpreter was used.      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 No pertinent surgical history.   OB History   No obstetric history on file.     Family History  Problem Relation Age of Onset   Diabetes Father    CAD Father     Social History   Tobacco Use   Smoking status: Every Day   Smokeless tobacco: Never  Vaping Use   Vaping Use: Never used  Substance Use Topics   Alcohol use: Yes   Drug use: Yes    Types: Marijuana, Cocaine    Comment: crack     Home Medications Prior to Admission medications   Medication Sig Start Date End Date Taking? Authorizing Provider  ALPRAZolam Prudy Feeler) 0.5 MG tablet Take 0.5 mg by mouth daily as needed for anxiety. 10/03/20  Yes [provider]  aspirin EC 81 MG EC tablet Take 1 tablet (81 mg total) by mouth daily. Swallow whole. 10/23/19  Yes Alwyn Ren, MD  atorvastatin (LIPITOR) 40 MG tablet Take 40 mg by mouth daily. 09/14/20  Yes [provider]  carvedilol (COREG) 3.125 MG tablet Take 3.125 mg by mouth 2 (two) times daily. 09/14/20  Yes [provider]  losartan (COZAAR) 25 MG tablet Take 1  tablet by mouth daily. 10/03/20  Yes [provider]  sulfamethoxazole-trimethoprim (BACTRIM) 400-80 MG tablet Take 1 tablet by mouth every Monday, Wednesday, and Friday. 10/06/20 11/05/20 Yes [provider]  valsartan (DIOVAN) 40 MG tablet Take 40 mg by mouth daily. 09/14/20  Yes [provider]  Darunavir-Cobicisctat-Emtricitabine-Tenofovir Alafenamide (SYMTUZA) 800-150-200-10 MG TABS Take 1 tablet by mouth daily with breakfast. Patient not taking: Reported on 10/04/2020 10/18/19   Tyrone Nine, MD  furosemide (LASIX) 40 MG tablet Take 1 tablet (40 mg total) by mouth daily. Patient not taking: Reported on 10/04/2020 11/13/19   Croitoru, Mihai, MD  losartan (COZAAR)  25 MG tablet Take 1 tablet (25 mg total) by mouth daily. Patient not taking: Reported on 10/04/2020 10/23/19   Alwyn Ren, MD  nicotine (NICODERM CQ - DOSED IN MG/24 HOURS) 14 mg/24hr patch Place 1 patch (14 mg total) onto the skin daily. Patient not taking: Reported on 10/04/2020 10/23/19   Alwyn Ren, MD    Allergies    Flagyl [metronidazole] and Cefaclor  Review of Systems   Review of Systems  All other systems reviewed and are negative.  Physical Exam Updated Vital Signs BP (!) 137/93 (BP Location: Left Arm)   Pulse 94   Temp 98.2 F (36.8 C) (Oral)   Resp 14   SpO2 97%   Physical Exam Constitutional:      General: She is not in acute distress.    Appearance: Normal appearance.  HENT:     Head: Normocephalic and atraumatic.  Eyes:     General:        Right eye: No discharge.        Left eye: No discharge.  Cardiovascular:     Comments: Regular rate and rhythm.  S1/S2 are distinct without any evidence of murmur, rubs, or gallops.  Radial pulses are 2+ bilaterally.  Dorsalis pedis pulses are 2+ bilaterally.  No evidence of pedal edema. Pulmonary:     Comments: Clear to auscultation bilaterally.  Normal effort.  No respiratory distress.  No evidence of wheezes, rales, or rhonchi heard throughout. Abdominal:     General: Abdomen is flat. Bowel sounds are normal. There is no distension.     Tenderness: There is no abdominal tenderness. There is no guarding or rebound.  Musculoskeletal:        General: Normal range of motion.     Cervical back: Neck supple.  Skin:    General: Skin is warm and dry.     Findings: No rash.  Neurological:     General: No focal deficit present.     Mental Status: She is alert.  Psychiatric:        Behavior: Behavior is uncooperative.     Comments: Somnolent    ED Results / Procedures / Treatments   Labs (all labs ordered are listed, but only abnormal results are displayed) Labs Reviewed  CBC WITH DIFFERENTIAL/PLATELET -  Abnormal; Notable for the following components:      Result Value   Platelets 107 (*)    All other components within normal limits  COMPREHENSIVE METABOLIC PANEL - Abnormal; Notable for the following components:   Creatinine, Ser 1.28 (*)    Albumin 2.9 (*)    GFR, Estimated 53 (*)    All other components within normal limits  BRAIN NATRIURETIC PEPTIDE - Abnormal; Notable for the following components:   B Natriuretic Peptide 544.5 (*)    All other components within normal limits  TROPONIN I (  HIGH SENSITIVITY) - Abnormal; Notable for the following components:   Troponin I (High Sensitivity) 37 (*)    All other components within normal limits  TROPONIN I (HIGH SENSITIVITY) - Abnormal; Notable for the following components:   Troponin I (High Sensitivity) 35 (*)    All other components within normal limits  RESP PANEL BY RT-PCR (FLU A&B, COVID) ARPGX2  LACTIC ACID, PLASMA  POC URINE PREG, ED    EKG None  Radiology DG Chest Port 1 View  Result Date: 10/04/2020 CLINICAL DATA:  Pain. EXAM: PORTABLE CHEST 1 VIEW COMPARISON:  09/28/2020 FINDINGS: The cardiomediastinal contours are normal for portable AP technique. The lungs are clear. Pulmonary vasculature is normal. No consolidation, pleural effusion, or pneumothorax. No acute osseous abnormalities are seen. IMPRESSION: No acute chest findings.  Mild scoliotic curvature of spine. Electronically Signed   By: Narda Rutherford M.D.   On: 10/04/2020 18:16    Procedures Procedures   Medications Ordered in ED Medications  ondansetron (ZOFRAN-ODT) disintegrating tablet 4 mg (4 mg Oral Given 10/04/20 1800)    ED Course  I have reviewed the triage vital signs and the nursing notes.  Pertinent labs & imaging results that were available during my care of the patient were reviewed by me and considered in my medical decision making (see chart for details).  Clinical Course as of 10/04/20 1943  Sat Oct 04, 2020  1819 Asked patient to ambulate in  the department so we can gauge pulse oximetry.  She has denied numerous times but was witnessed multiple times ambulating within the room. [CF]    Clinical Course User Index [CF] Jolyn Lent   MDM Rules/Calculators/A&P                         Kamyra Schroeck is a 45 year old female with history of polysubstance abuse and heart failure with reduced ejection fraction who presents to the emergency department today for further evaluation of chest pain, shortness of breath, and fatigue.  Clinically she does not appear volume overloaded.  She is hemodynamically stable.  It was difficult to get a adequate history as the patient was uncooperative and agitated.  Old records were reviewed and it seems that she is doing better from her time at [facility].  She was just discharged yesterday after a weeklong admission for heart failure exacerbation. She is well perfused clinically  BNP was 544 which is improved from 630 based on old records.  She does not appear to be septic.  Troponins are downtrending here in the department today.  Chest x-ray was within normal limits and improved with comparison to previous.  No evidence of significant cardiomegaly or fluid overload.  Patient was uncooperative but answered questions appropriately. Patient with no focal neurological deficits. She has a long history f poly substance abuse. well during her time in the department.  I have a low suspicion for acute CNS pathology. Work-up today is reassuring in comparison to results at [facility].  She is currently hemodynamically stable and has been during her ED course.  She will work will provide resources as she is homeless, after being discharged from the hospital there may be some element of secondary gain.  I have given her a printout for various homeless shelters. Strict return precautions given.  Will provide her [facility] for follow-up.   Final Clinical Impression(s) / ED Diagnoses Final  diagnoses:  Fatigue, unspecified type  Rx / DC Orders ED Discharge Orders     None        Teressa Lower, [place] 10/04/20 1944    Ernie Avena, MD 10/05/20 1252

## 2020-10-04 NOTE — ED Notes (Signed)
Pt ambulated with a steady gait, no assistance needed; O2 sats 94-99%; no difficulty noted or expressed; pt requests soda and food; reports she does not a have anywhere to go until 0000; when asked patient about speaking with SW, pt states "they can't do shit, well it's not that they can't, it's that they won't"; EDP notified, food and drink given

## 2020-10-04 NOTE — ED Notes (Signed)
Patient refusing to be connected to monitor for EKG, 02 sat or BP monitoring.

## 2020-10-04 NOTE — Discharge Instructions (Addendum)
You were seen and evaluated in the emergency department today for further evaluation of fatigue.  As we discussed, all of your labs and imaging are much improved when comparing to your admission at [facility].  I firmly suggest that you stop using cocaine and marijuana.  I have given you handouts for shelters.  Please return to the emergency department for worsening chest pain, worsening shortness of breath, if you pass out, swelling in your limbs, or any other concerns you might have.

## 2020-10-04 NOTE — ED Notes (Signed)
Attempted to ambulate pt with pulse ox, pt refused, stating "I just threw up"

## 2020-10-04 NOTE — ED Triage Notes (Signed)
Pt to triage via [facility] from a hotel. On arrival to triage patient laid down in triage floor.  Assisted to recliner.  Reports fatigue and generalized body aches x 1 week.  States she was just discharged after being at [facility] for over a week.  Also reports "kidney pain".  Pt has Rx for antibiotic but states she is unsure why she was admitted there.

## 2020-10-04 NOTE — ED Notes (Signed)
Continues to refuse ambulation for pulse ox monitoring.

## 2020-10-04 NOTE — ED Notes (Signed)
Patient given water and sandwich per provider and patient request.

## 2020-10-05 NOTE — Care Management (Signed)
Consult from last night,for homelessness patient stated she had somewhere she could stay at midnight. Last admission was [place]/28  [place]. She has had multiple ED visits from June, in those visits, homeless resources were given to the patient. She has refused resources in the past , and has been escorted out of the premises prior (07/16/20). Patient has not been compliant with seeking out given resources.

## 2020-12-13 ENCOUNTER — Other Ambulatory Visit: Payer: Self-pay

## 2020-12-13 ENCOUNTER — Emergency Department (HOSPITAL_COMMUNITY): Payer: Medicaid Other

## 2020-12-13 ENCOUNTER — Inpatient Hospital Stay (HOSPITAL_COMMUNITY)
Admission: EM | Admit: 2020-12-13 | Discharge: 2020-12-17 | DRG: 975 | Disposition: A | Discharge status: Home / Self Care | Payer: Medicaid Other | Attending: Internal Medicine | Admitting: Internal Medicine

## 2020-12-13 ENCOUNTER — Encounter (HOSPITAL_COMMUNITY): Payer: Self-pay | Admitting: Emergency Medicine

## 2020-12-13 DIAGNOSIS — F17210 Nicotine dependence, cigarettes, uncomplicated: Secondary | ICD-10-CM | POA: Diagnosis present

## 2020-12-13 DIAGNOSIS — I5022 Chronic systolic (congestive) heart failure: Secondary | ICD-10-CM | POA: Diagnosis present

## 2020-12-13 DIAGNOSIS — I501 Left ventricular failure: Secondary | ICD-10-CM | POA: Diagnosis present

## 2020-12-13 DIAGNOSIS — I110 Hypertensive heart disease with heart failure: Secondary | ICD-10-CM | POA: Diagnosis present

## 2020-12-13 DIAGNOSIS — Z5900 Homelessness unspecified: Secondary | ICD-10-CM

## 2020-12-13 DIAGNOSIS — F419 Anxiety disorder, unspecified: Secondary | ICD-10-CM | POA: Diagnosis present

## 2020-12-13 DIAGNOSIS — Z881 Allergy status to other antibiotic agents status: Secondary | ICD-10-CM

## 2020-12-13 DIAGNOSIS — Z79899 Other long term (current) drug therapy: Secondary | ICD-10-CM

## 2020-12-13 DIAGNOSIS — Z20822 Contact with and (suspected) exposure to covid-19: Secondary | ICD-10-CM | POA: Diagnosis present

## 2020-12-13 DIAGNOSIS — Z91014 Allergy to mammalian meats: Secondary | ICD-10-CM

## 2020-12-13 DIAGNOSIS — J159 Unspecified bacterial pneumonia: Principal | ICD-10-CM | POA: Diagnosis present

## 2020-12-13 DIAGNOSIS — Z8701 Personal history of pneumonia (recurrent): Secondary | ICD-10-CM

## 2020-12-13 DIAGNOSIS — Z91120 Patient's intentional underdosing of medication regimen due to financial hardship: Secondary | ICD-10-CM

## 2020-12-13 DIAGNOSIS — Y929 Unspecified place or not applicable: Secondary | ICD-10-CM

## 2020-12-13 DIAGNOSIS — T50996A Underdosing of other drugs, medicaments and biological substances, initial encounter: Secondary | ICD-10-CM | POA: Diagnosis present

## 2020-12-13 DIAGNOSIS — D649 Anemia, unspecified: Secondary | ICD-10-CM | POA: Diagnosis present

## 2020-12-13 DIAGNOSIS — D72819 Decreased white blood cell count, unspecified: Secondary | ICD-10-CM | POA: Diagnosis present

## 2020-12-13 DIAGNOSIS — Z833 Family history of diabetes mellitus: Secondary | ICD-10-CM

## 2020-12-13 DIAGNOSIS — B20 Human immunodeficiency virus [HIV] disease: Secondary | ICD-10-CM | POA: Diagnosis present

## 2020-12-13 DIAGNOSIS — F4310 Post-traumatic stress disorder, unspecified: Secondary | ICD-10-CM | POA: Diagnosis present

## 2020-12-13 DIAGNOSIS — N179 Acute kidney failure, unspecified: Secondary | ICD-10-CM | POA: Diagnosis present

## 2020-12-13 DIAGNOSIS — Z681 Body mass index (BMI) 19 or less, adult: Secondary | ICD-10-CM

## 2020-12-13 DIAGNOSIS — I509 Heart failure, unspecified: Secondary | ICD-10-CM

## 2020-12-13 DIAGNOSIS — J189 Pneumonia, unspecified organism: Secondary | ICD-10-CM | POA: Diagnosis present

## 2020-12-13 DIAGNOSIS — Z8249 Family history of ischemic heart disease and other diseases of the circulatory system: Secondary | ICD-10-CM

## 2020-12-13 DIAGNOSIS — E440 Moderate protein-calorie malnutrition: Secondary | ICD-10-CM | POA: Insufficient documentation

## 2020-12-13 LAB — BRAIN NATRIURETIC PEPTIDE: B Natriuretic Peptide: 69 pg/mL (ref 0.0–100.0)

## 2020-12-13 LAB — CBC WITH DIFFERENTIAL/PLATELET
Abs Immature Granulocytes: 0.01 10*3/uL (ref 0.00–0.07)
Basophils Absolute: 0 10*3/uL (ref 0.0–0.1)
Basophils Relative: 1 %
Eosinophils Absolute: 0.1 10*3/uL (ref 0.0–0.5)
Eosinophils Relative: 2 %
HCT: 31.7 % — ABNORMAL LOW (ref 36.0–46.0)
Hemoglobin: 10.2 g/dL — ABNORMAL LOW (ref 12.0–15.0)
Immature Granulocytes: 0 %
Lymphocytes Relative: 40 %
Lymphs Abs: 1.1 10*3/uL (ref 0.7–4.0)
MCH: 27.2 pg (ref 26.0–34.0)
MCHC: 32.2 g/dL (ref 30.0–36.0)
MCV: 84.5 fL (ref 80.0–100.0)
Monocytes Absolute: 0.5 10*3/uL (ref 0.1–1.0)
Monocytes Relative: 17 %
Neutro Abs: 1.2 10*3/uL — ABNORMAL LOW (ref 1.7–7.7)
Neutrophils Relative %: 40 %
Platelets: 178 10*3/uL (ref 150–400)
RBC: 3.75 MIL/uL — ABNORMAL LOW (ref 3.87–5.11)
RDW: 15.1 % (ref 11.5–15.5)
WBC: 2.9 10*3/uL — ABNORMAL LOW (ref 4.0–10.5)
nRBC: 0 % (ref 0.0–0.2)

## 2020-12-13 LAB — BASIC METABOLIC PANEL
Anion gap: 10 (ref 5–15)
BUN: 15 mg/dL (ref 6–20)
CO2: 23 mmol/L (ref 22–32)
Calcium: 8.1 mg/dL — ABNORMAL LOW (ref 8.9–10.3)
Chloride: 99 mmol/L (ref 98–111)
Creatinine, Ser: 1.7 mg/dL — ABNORMAL HIGH (ref 0.44–1.00)
GFR, Estimated: 38 mL/min — ABNORMAL LOW (ref >60)
Glucose, Bld: 128 mg/dL — ABNORMAL HIGH (ref 70–99)
Potassium: 3.1 mmol/L — ABNORMAL LOW (ref 3.5–5.1)
Sodium: 132 mmol/L — ABNORMAL LOW (ref 135–145)

## 2020-12-13 LAB — TROPONIN I (HIGH SENSITIVITY)
Troponin I (High Sensitivity): 19 ng/L — ABNORMAL HIGH (ref <18)
Troponin I (High Sensitivity): 24 ng/L — ABNORMAL HIGH (ref <18)

## 2020-12-13 NOTE — ED Provider Notes (Signed)
Emergency Medicine Provider Triage Evaluation Note  Alyssa Barnett , a 45 y.o. female  was evaluated in triage.  Pt complains of being diagnosed with pneumonia while in [place] visiting family 2 weeks ago.  Patient reports that she was discharged home with a few days of Bactrim secondary to her history of HIV.  She is not currently on anything for HIV.  She states that she has continued to have right-sided chest pain since being diagnosed however today while at [employer] her pain worsened prompting EMS call.  Patient also complains of shortness of breath and cough.  She denies fevers.  He is a 1 to every day smoker.  Review of Systems  Positive: + cough, chest pain, SOB Negative: - fevers  Physical Exam  BP (!) 149/92 (BP Location: Left Arm)   Pulse 88   Temp (!) 97.2 F (36.2 C) (Oral)   Resp 18   SpO2 100%  Gen:   Awake, no distress   Resp:  Normal effort. LCTAB MSK:   Moves extremities without difficulty  Other:  RRR  Medical Decision Making  Medically screening exam initiated at 5:58 PM.  Appropriate orders placed.  Lorree Millar [place] was informed that the remainder of the evaluation will be completed by another provider, this initial triage assessment does not replace that evaluation, and the importance of remaining in the ED until their evaluation is complete.     Tanda Rockers, PA-C 12/13/20 1800    Gloris Manchester, MD 12/14/20 [phone]

## 2020-12-13 NOTE — ED Triage Notes (Signed)
Pt to triage via [facility] from [employer] where she was just sitting.  Reports continued chest pain and SOB since she was treated for pneumonia 2 weeks ago in [place].

## 2020-12-14 ENCOUNTER — Encounter (HOSPITAL_COMMUNITY): Payer: Self-pay | Admitting: Student in an Organized Health Care Education/Training Program

## 2020-12-14 DIAGNOSIS — J189 Pneumonia, unspecified organism: Secondary | ICD-10-CM | POA: Diagnosis present

## 2020-12-14 DIAGNOSIS — F419 Anxiety disorder, unspecified: Secondary | ICD-10-CM | POA: Diagnosis present

## 2020-12-14 DIAGNOSIS — Y929 Unspecified place or not applicable: Secondary | ICD-10-CM | POA: Diagnosis not present

## 2020-12-14 DIAGNOSIS — I5020 Unspecified systolic (congestive) heart failure: Secondary | ICD-10-CM

## 2020-12-14 DIAGNOSIS — F4310 Post-traumatic stress disorder, unspecified: Secondary | ICD-10-CM

## 2020-12-14 DIAGNOSIS — D72819 Decreased white blood cell count, unspecified: Secondary | ICD-10-CM | POA: Diagnosis present

## 2020-12-14 DIAGNOSIS — D649 Anemia, unspecified: Secondary | ICD-10-CM

## 2020-12-14 DIAGNOSIS — Z8249 Family history of ischemic heart disease and other diseases of the circulatory system: Secondary | ICD-10-CM | POA: Diagnosis not present

## 2020-12-14 DIAGNOSIS — Z91120 Patient's intentional underdosing of medication regimen due to financial hardship: Secondary | ICD-10-CM | POA: Diagnosis not present

## 2020-12-14 DIAGNOSIS — Z79899 Other long term (current) drug therapy: Secondary | ICD-10-CM | POA: Diagnosis not present

## 2020-12-14 DIAGNOSIS — N179 Acute kidney failure, unspecified: Secondary | ICD-10-CM

## 2020-12-14 DIAGNOSIS — J159 Unspecified bacterial pneumonia: Secondary | ICD-10-CM | POA: Diagnosis present

## 2020-12-14 DIAGNOSIS — F17210 Nicotine dependence, cigarettes, uncomplicated: Secondary | ICD-10-CM | POA: Diagnosis present

## 2020-12-14 DIAGNOSIS — Z8701 Personal history of pneumonia (recurrent): Secondary | ICD-10-CM | POA: Diagnosis not present

## 2020-12-14 DIAGNOSIS — I509 Heart failure, unspecified: Secondary | ICD-10-CM | POA: Diagnosis not present

## 2020-12-14 DIAGNOSIS — E440 Moderate protein-calorie malnutrition: Secondary | ICD-10-CM | POA: Diagnosis present

## 2020-12-14 DIAGNOSIS — Z5900 Homelessness unspecified: Secondary | ICD-10-CM | POA: Diagnosis not present

## 2020-12-14 DIAGNOSIS — Z833 Family history of diabetes mellitus: Secondary | ICD-10-CM | POA: Diagnosis not present

## 2020-12-14 DIAGNOSIS — B20 Human immunodeficiency virus [HIV] disease: Secondary | ICD-10-CM

## 2020-12-14 DIAGNOSIS — Z681 Body mass index (BMI) 19 or less, adult: Secondary | ICD-10-CM | POA: Diagnosis not present

## 2020-12-14 DIAGNOSIS — I5022 Chronic systolic (congestive) heart failure: Secondary | ICD-10-CM | POA: Diagnosis present

## 2020-12-14 DIAGNOSIS — I110 Hypertensive heart disease with heart failure: Secondary | ICD-10-CM | POA: Diagnosis present

## 2020-12-14 DIAGNOSIS — Z20822 Contact with and (suspected) exposure to covid-19: Secondary | ICD-10-CM | POA: Diagnosis present

## 2020-12-14 DIAGNOSIS — T50996A Underdosing of other drugs, medicaments and biological substances, initial encounter: Secondary | ICD-10-CM | POA: Diagnosis present

## 2020-12-14 DIAGNOSIS — Z881 Allergy status to other antibiotic agents status: Secondary | ICD-10-CM | POA: Diagnosis not present

## 2020-12-14 DIAGNOSIS — Z91014 Allergy to mammalian meats: Secondary | ICD-10-CM | POA: Diagnosis not present

## 2020-12-14 LAB — CBC WITH DIFFERENTIAL/PLATELET
Abs Immature Granulocytes: 0 10*3/uL (ref 0.00–0.07)
Basophils Absolute: 0 10*3/uL (ref 0.0–0.1)
Basophils Relative: 1 %
Eosinophils Absolute: 0.1 10*3/uL (ref 0.0–0.5)
Eosinophils Relative: 3 %
HCT: 29 % — ABNORMAL LOW (ref 36.0–46.0)
Hemoglobin: 9.4 g/dL — ABNORMAL LOW (ref 12.0–15.0)
Immature Granulocytes: 1 %
Lymphocytes Relative: 43 %
Lymphs Abs: 1 10*3/uL (ref 0.7–4.0)
MCH: 27.1 pg (ref 26.0–34.0)
MCHC: 32.4 g/dL (ref 30.0–36.0)
MCV: 83.6 fL (ref 80.0–100.0)
Monocytes Absolute: 0.4 10*3/uL (ref 0.1–1.0)
Monocytes Relative: 18 %
Neutro Abs: 0.8 10*3/uL — ABNORMAL LOW (ref 1.7–7.7)
Neutrophils Relative %: 34 %
Platelets: 156 10*3/uL (ref 150–400)
RBC: 3.47 MIL/uL — ABNORMAL LOW (ref 3.87–5.11)
RDW: 15.1 % (ref 11.5–15.5)
WBC: 2.3 10*3/uL — ABNORMAL LOW (ref 4.0–10.5)
nRBC: 0 % (ref 0.0–0.2)
nRBC: 0 /100 WBC

## 2020-12-14 LAB — RESPIRATORY PANEL BY PCR

## 2020-12-14 LAB — COMPREHENSIVE METABOLIC PANEL
ALT: 15 U/L (ref 0–44)
AST: 28 U/L (ref 15–41)
Albumin: 2.4 g/dL — ABNORMAL LOW (ref 3.5–5.0)
Alkaline Phosphatase: 66 U/L (ref 38–126)
Anion gap: 7 (ref 5–15)
BUN: 15 mg/dL (ref 6–20)
CO2: 24 mmol/L (ref 22–32)
Calcium: 7.8 mg/dL — ABNORMAL LOW (ref 8.9–10.3)
Chloride: 102 mmol/L (ref 98–111)
Creatinine, Ser: 1.44 mg/dL — ABNORMAL HIGH (ref 0.44–1.00)
GFR, Estimated: 46 mL/min — ABNORMAL LOW (ref >60)
Glucose, Bld: 93 mg/dL (ref 70–99)
Potassium: 3.6 mmol/L (ref 3.5–5.1)
Sodium: 133 mmol/L — ABNORMAL LOW (ref 135–145)
Total Bilirubin: 0.7 mg/dL (ref 0.3–1.2)
Total Protein: 7.1 g/dL (ref 6.5–8.1)

## 2020-12-14 LAB — MRSA NEXT GEN BY PCR, NASAL: MRSA by PCR Next Gen: NOT DETECTED

## 2020-12-14 LAB — IRON AND TIBC
Iron: 39 ug/dL (ref 28–170)
Saturation Ratios: 15 % (ref 10.4–31.8)
TIBC: 266 ug/dL (ref 250–450)
UIBC: 227 ug/dL

## 2020-12-14 LAB — URINALYSIS, ROUTINE W REFLEX MICROSCOPIC
Bilirubin Urine: NEGATIVE
Glucose, UA: NEGATIVE mg/dL
Hgb urine dipstick: NEGATIVE
Ketones, ur: NEGATIVE mg/dL
Leukocytes,Ua: NEGATIVE
Nitrite: NEGATIVE
Protein, ur: NEGATIVE mg/dL
Specific Gravity, Urine: 1.012 (ref 1.005–1.030)
pH: 6 (ref 5.0–8.0)

## 2020-12-14 LAB — PROTIME-INR
INR: 1.2 (ref 0.8–1.2)
Prothrombin Time: 15.4 seconds — ABNORMAL HIGH (ref 11.4–15.2)

## 2020-12-14 LAB — FERRITIN: Ferritin: 45 ng/mL (ref 11–307)

## 2020-12-14 LAB — MAGNESIUM: Magnesium: 1.7 mg/dL (ref 1.7–2.4)

## 2020-12-14 LAB — RESP PANEL BY RT-PCR (FLU A&B, COVID) ARPGX2
Influenza A by PCR: NEGATIVE
Influenza B by PCR: NEGATIVE
SARS Coronavirus 2 by RT PCR: NEGATIVE

## 2020-12-14 LAB — RETICULOCYTES
Immature Retic Fract: 10.5 % (ref 2.3–15.9)
RBC.: 3.48 MIL/uL — ABNORMAL LOW (ref 3.87–5.11)
Retic Count, Absolute: 71 10*3/uL (ref 19.0–186.0)
Retic Ct Pct: 2 % (ref 0.4–3.1)

## 2020-12-14 LAB — PHOSPHORUS: Phosphorus: 4.5 mg/dL (ref 2.5–4.6)

## 2020-12-14 LAB — VITAMIN B12: Vitamin B-12: 305 pg/mL (ref 180–914)

## 2020-12-14 LAB — APTT: aPTT: 33 seconds (ref 24–36)

## 2020-12-14 LAB — FOLATE: Folate: 18.6 ng/mL (ref >5.9)

## 2020-12-14 LAB — TECHNOLOGIST SMEAR REVIEW

## 2020-12-14 LAB — PROCALCITONIN: Procalcitonin: <0.1 ng/mL

## 2020-12-14 MED ORDER — SULFAMETHOXAZOLE-TRIMETHOPRIM 800-160 MG PO TABS
2.0000 | ORAL_TABLET | Freq: Three times a day (TID) | ORAL | Status: DC
  Administered 2020-12-14 – 2020-12-15 (×5): 2 via ORAL
  Filled 2020-12-14 (×5): qty 2

## 2020-12-14 MED ORDER — FOLIC ACID 1 MG PO TABS
1.0000 mg | ORAL_TABLET | Freq: Every day | ORAL | Status: DC
  Administered 2020-12-14 – 2020-12-17 (×2): 1 mg via ORAL
  Filled 2020-12-14 (×4): qty 1

## 2020-12-14 MED ORDER — THIAMINE HCL 100 MG PO TABS
100.0000 mg | ORAL_TABLET | Freq: Every day | ORAL | Status: DC
  Administered 2020-12-14 – 2020-12-17 (×2): 100 mg via ORAL
  Filled 2020-12-14 (×4): qty 1

## 2020-12-14 MED ORDER — SODIUM CHLORIDE 0.9 % IV SOLN
2.0000 g | Freq: Once | INTRAVENOUS | Status: AC
  Administered 2020-12-14: 2 g via INTRAVENOUS
  Filled 2020-12-14: qty 2

## 2020-12-14 MED ORDER — KETOROLAC TROMETHAMINE 15 MG/ML IJ SOLN
15.0000 mg | Freq: Once | INTRAMUSCULAR | Status: AC
  Administered 2020-12-14: 15 mg via INTRAVENOUS
  Filled 2020-12-14: qty 1

## 2020-12-14 MED ORDER — SULFAMETHOXAZOLE-TRIMETHOPRIM 800-160 MG PO TABS
2.0000 | ORAL_TABLET | Freq: Two times a day (BID) | ORAL | Status: DC
  Filled 2020-12-14: qty 2

## 2020-12-14 MED ORDER — ALBUTEROL SULFATE (2.5 MG/3ML) 0.083% IN NEBU: 2.5000 mg | INHALATION_SOLUTION | RESPIRATORY_TRACT | Status: DC | PRN

## 2020-12-14 MED ORDER — BUSPIRONE HCL 5 MG PO TABS
5.0000 mg | ORAL_TABLET | Freq: Two times a day (BID) | ORAL | Status: DC
  Administered 2020-12-14 – 2020-12-17 (×7): 5 mg via ORAL
  Filled 2020-12-14 (×7): qty 1

## 2020-12-14 MED ORDER — SODIUM CHLORIDE 0.9% FLUSH
3.0000 mL | Freq: Two times a day (BID) | INTRAVENOUS | Status: DC
  Administered 2020-12-14 – 2020-12-17 (×6): 3 mL via INTRAVENOUS

## 2020-12-14 MED ORDER — RIVAROXABAN 10 MG PO TABS
10.0000 mg | ORAL_TABLET | Freq: Every day | ORAL | Status: DC
  Administered 2020-12-14 – 2020-12-17 (×4): 10 mg via ORAL
  Filled 2020-12-14 (×4): qty 1

## 2020-12-14 MED ORDER — VANCOMYCIN HCL 500 MG/100ML IV SOLN
500.0000 mg | INTRAVENOUS | Status: DC
  Administered 2020-12-15: 500 mg via INTRAVENOUS
  Filled 2020-12-14 (×2): qty 100

## 2020-12-14 MED ORDER — ADULT MULTIVITAMIN W/MINERALS CH
1.0000 | ORAL_TABLET | Freq: Every day | ORAL | Status: DC
  Administered 2020-12-14: 1 via ORAL
  Filled 2020-12-14 (×3): qty 1

## 2020-12-14 MED ORDER — CARVEDILOL 3.125 MG PO TABS
3.1250 mg | ORAL_TABLET | Freq: Two times a day (BID) | ORAL | Status: DC
  Administered 2020-12-14 – 2020-12-17 (×7): 3.125 mg via ORAL
  Filled 2020-12-14 (×7): qty 1

## 2020-12-14 MED ORDER — ATORVASTATIN CALCIUM 40 MG PO TABS
40.0000 mg | ORAL_TABLET | Freq: Every day | ORAL | Status: DC
  Administered 2020-12-14 – 2020-12-17 (×4): 40 mg via ORAL
  Filled 2020-12-14 (×4): qty 1

## 2020-12-14 MED ORDER — VANCOMYCIN HCL 1000 MG/200ML IV SOLN
1000.0000 mg | Freq: Once | INTRAVENOUS | Status: AC
  Administered 2020-12-14: 1000 mg via INTRAVENOUS
  Filled 2020-12-14: qty 200

## 2020-12-14 MED ORDER — SODIUM CHLORIDE 0.9 % IV BOLUS
250.0000 mL | Freq: Once | INTRAVENOUS | Status: AC
  Administered 2020-12-14: 250 mL via INTRAVENOUS

## 2020-12-14 MED ORDER — SODIUM CHLORIDE 0.9 % IV SOLN
2.0000 g | Freq: Two times a day (BID) | INTRAVENOUS | Status: DC
  Administered 2020-12-14 – 2020-12-16 (×4): 2 g via INTRAVENOUS
  Filled 2020-12-14 (×4): qty 2

## 2020-12-14 MED ORDER — POTASSIUM CHLORIDE 10 MEQ/100ML IV SOLN
10.0000 meq | Freq: Once | INTRAVENOUS | Status: AC
  Administered 2020-12-14: 10 meq via INTRAVENOUS
  Filled 2020-12-14: qty 100

## 2020-12-14 MED ORDER — ACETAMINOPHEN 325 MG PO TABS
650.0000 mg | ORAL_TABLET | Freq: Four times a day (QID) | ORAL | Status: DC | PRN
  Administered 2020-12-14 – 2020-12-17 (×2): 650 mg via ORAL
  Filled 2020-12-14 (×2): qty 2

## 2020-12-14 MED ORDER — HYDROCODONE-ACETAMINOPHEN 5-325 MG PO TABS
1.0000 | ORAL_TABLET | Freq: Once | ORAL | Status: AC
  Administered 2020-12-14: 1 via ORAL
  Filled 2020-12-14: qty 1

## 2020-12-14 MED ORDER — NICOTINE 14 MG/24HR TD PT24
14.0000 mg | MEDICATED_PATCH | Freq: Every day | TRANSDERMAL | Status: DC
  Administered 2020-12-14 – 2020-12-17 (×4): 14 mg via TRANSDERMAL
  Filled 2020-12-14 (×4): qty 1

## 2020-12-14 NOTE — ED Notes (Signed)
Pt requesting pain meds. Rating 7/10, no PRN meds ordered. Paged Internal Medicine intern.

## 2020-12-14 NOTE — Hospital Course (Signed)
Past Medical History:  Diagnosis Date   ADHD    Bacterial vaginosis    Hepatitis B    HIV (human immunodeficiency virus infection) (HCC)    Hypertension    Pneumonia    PTSD (post-traumatic stress disorder)    Substance use disorder    Multifocal pneumonia  Chest pain Recently discharge in [place] for 2 weeks for pneumonia Homelessness Not on [place] A year ago

## 2020-12-14 NOTE — Progress Notes (Signed)
Pharmacy Antibiotic Note  Alyssa Barnett is a 45 y.o. female admitted on 12/13/2020 with concern for PNA. Pharmacy has been consulted for Vancomycin + Cefepime dosing.  Allergy to Cefaclor of "itching" - not contraindication to use cephalosporins, will monitor for this with Cefepime. SCr 1.7, normalized CrCl~40-50 ml/min.   Plan: - Vancomycin 1g IV x 1 followed by 500 mg IV every 24 hours (eAUC 429, TBW since <IBW, Vd 0.72) - Cefepime 2g IV every 12 hours - Will continue to follow renal function, culture results, LOT, and antibiotic de-escalation plans      Temp (24hrs), Avg:97.5 F (36.4 C), Min:97.2 F (36.2 C), Max:97.7 F (36.5 C)  Recent Labs  Lab 12/13/20 1759  WBC 2.9*  CREATININE 1.70*    CrCl cannot be calculated (Unknown ideal weight.).    Allergies  Allergen Reactions   Flagyl [Metronidazole] Nausea And Vomiting    Pt states severe, violent vomiting.   Cefaclor Itching   Pork-Derived Products     Other reaction(s): Other (See Comments) Pt states she has a religious objection to consuming pork    Antimicrobials this admission: Vanc 11/13 >> Cefepime 11/13 >>  Dose adjustments this admission:  Microbiology results:   Thank you for allowing pharmacy to be a part of this patient's care.  Georgina Pillion, PharmD, BCPS Clinical Pharmacist Clinical phone for 12/14/2020: [phone] 12/14/2020 8:14 AM   **Pharmacist phone directory can now be found on [email or web address] ([email or web address]).  Listed under [facility].

## 2020-12-14 NOTE — ED Provider Notes (Signed)
[facility] EMERGENCY DEPARTMENT Provider Note   CSN: [id number] Arrival date & time: 12/13/20  1751     History Chief Complaint  Patient presents with   Chest Pain    Alyssa Barnett is a 45 y.o. female.  With past medical history of HIV/AIDS, hypertension, HBV, HFrEF EF of 25 to 30% who presents to the emergency department with chest pain.  On interview the patient states "I think my pneumonia is coming back."  She states that she is having right lower chest pain under her breast.  She states that this is where she was having pain when she previously had pneumonia.  She states that she was discharged out of the hospital in [place] after a 2-week stay for pneumonia.  She states that she was discharged on Tuesday.  States that she was given a couple of days of Bactrim due to her HIV which is since ended.  She states over the past 2 days she has started to feel similar chest pain.  She does endorse that her work of breathing has increased since then.  States that she had a fever earlier today.  Subjective fever states that she had chills and then broke out in a sweat.  Endorses unproductive cough but has baseline cough.  Current every day smoker.  She denies alcohol or drug use.  Regarding her HIV/AIDS she states that she does not take antiretrovirals and that "nobody gives them to her."  She states that she has not taken them since she was diagnosed with heart failure which appears to be in September 2021.  She states that she does have an appointment at the HIV clinic in [place] at the end of this month.  She states her last visit was in August or September.  Last HIV quant 3k in 10/2019, last CD4 count 476. States that her current medications are Lasix, carvedilol, atorvastatin, BuSpar.   Chest Pain Associated symptoms: cough, fever and shortness of breath   Associated symptoms: no nausea and no vomiting       Past Medical History:  Diagnosis Date   ADHD     Bacterial vaginosis    Hepatitis B    HIV (human immunodeficiency virus infection) (HCC)    Hypertension    Pneumonia    PTSD (post-traumatic stress disorder)    Substance use disorder     Patient Active Problem List   Diagnosis Date Noted   Fall    Heart failure, left, with LVEF <=30% (HCC)    Acquired immunodeficiency syndrome (AIDS) with bacterial pneumonia (HCC) 10/16/2019   New onset of congestive heart failure (HCC) 10/16/2019   Polysubstance abuse (HCC) 10/16/2019   Tobacco dependence 10/16/2019    History reviewed. No pertinent surgical history.   OB History   No obstetric history on file.     Family History  Problem Relation Age of Onset   Diabetes Father    CAD Father     Social History   Tobacco Use   Smoking status: Every Day   Smokeless tobacco: Never  Vaping Use   Vaping Use: Never used  Substance Use Topics   Alcohol use: Yes   Drug use: Yes    Types: Marijuana, Cocaine    Comment: crack     Home Medications Prior to Admission medications   Medication Sig Start Date End Date Taking? Authorizing Provider  ALPRAZolam Prudy Feeler) 0.5 MG tablet Take 0.5 mg by mouth daily as needed for anxiety. 10/03/20  [provider]  aspirin EC 81 MG EC tablet Take 1 tablet (81 mg total) by mouth daily. Swallow whole. 10/23/19   Alwyn Ren, MD  atorvastatin (LIPITOR) 40 MG tablet Take 40 mg by mouth daily. 09/14/20   [provider]  carvedilol (COREG) 3.125 MG tablet Take 3.125 mg by mouth 2 (two) times daily. 09/14/20   [provider]  Darunavir-Cobicisctat-Emtricitabine-Tenofovir Alafenamide (SYMTUZA) 800-150-200-10 MG TABS Take 1 tablet by mouth daily with breakfast. Patient not taking: Reported on 10/04/2020 10/18/19   Tyrone Nine, MD  furosemide (LASIX) 40 MG tablet Take 1 tablet (40 mg total) by mouth daily. Patient not taking: Reported on 10/04/2020 11/13/19   Croitoru, Mihai, MD  losartan (COZAAR) 25 MG tablet Take 1 tablet (25  mg total) by mouth daily. Patient not taking: Reported on 10/04/2020 10/23/19   Alwyn Ren, MD  losartan (COZAAR) 25 MG tablet Take 1 tablet by mouth daily. 10/03/20   [provider]  nicotine (NICODERM CQ - DOSED IN MG/24 HOURS) 14 mg/24hr patch Place 1 patch (14 mg total) onto the skin daily. Patient not taking: Reported on 10/04/2020 10/23/19   Alwyn Ren, MD  valsartan (DIOVAN) 40 MG tablet Take 40 mg by mouth daily. 09/14/20   [provider]    Allergies    Flagyl [metronidazole] and Cefaclor  Review of Systems   Review of Systems  Constitutional:  Positive for chills and fever.  Respiratory:  Positive for cough and shortness of breath.   Cardiovascular:  Positive for chest pain.  Gastrointestinal:  Negative for diarrhea, nausea and vomiting.  Skin:  Negative for rash.  Allergic/Immunologic: Positive for immunocompromised state.  Hematological:  Negative for adenopathy.  All other systems reviewed and are negative.  Physical Exam Updated Vital Signs BP (!) 133/94   Pulse 69   Temp (!) 97.2 F (36.2 C) (Oral)   Resp 16   LMP 09/30/2019   SpO2 100%   Physical Exam Vitals and nursing note reviewed.  Constitutional:      General: She is not in acute distress.    Appearance: Normal appearance. She is underweight. She is ill-appearing.  HENT:     Head: Normocephalic and atraumatic.     Mouth/Throat:     Mouth: Mucous membranes are moist. No oral lesions.     Pharynx: Oropharynx is clear. No posterior oropharyngeal erythema.  Eyes:     General: No scleral icterus.    Extraocular Movements: Extraocular movements intact.     Pupils: Pupils are equal, round, and reactive to light.  Neck:     Vascular: No JVD.  Cardiovascular:     Rate and Rhythm: Normal rate and regular rhythm.     Pulses:          Radial pulses are 2+ on the right side and 2+ on the left side.       Dorsalis pedis pulses are 2+ on the right side and 2+ on the left side.      Heart sounds: Normal heart sounds. No murmur heard. Pulmonary:     Effort: Pulmonary effort is normal. No tachypnea or respiratory distress.     Breath sounds: Examination of the right-upper field reveals decreased breath sounds. Examination of the right-middle field reveals decreased breath sounds and rhonchi. Examination of the right-lower field reveals decreased breath sounds and rales. Decreased breath sounds, rhonchi and rales present.  Chest:     Chest wall: Tenderness present.  Abdominal:  General: Bowel sounds are normal.     Palpations: Abdomen is soft.  Musculoskeletal:     Cervical back: Normal range of motion and neck supple.     Right lower leg: No edema.     Left lower leg: No edema.  Skin:    General: Skin is warm and dry.     Capillary Refill: Capillary refill takes less than 2 seconds.     Findings: No rash.  Neurological:     General: No focal deficit present.     Mental Status: She is alert and oriented to person, place, and time.  Psychiatric:        Mood and Affect: Mood normal.        Behavior: Behavior normal. Behavior is cooperative.    ED Results / Procedures / Treatments   Labs (all labs ordered are listed, but only abnormal results are displayed) Labs Reviewed  BASIC METABOLIC PANEL - Abnormal; Notable for the following components:      Result Value   Sodium 132 (*)    Potassium 3.1 (*)    Glucose, Bld 128 (*)    Creatinine, Ser 1.70 (*)    Calcium 8.1 (*)    GFR, Estimated 38 (*)    All other components within normal limits  CBC WITH DIFFERENTIAL/PLATELET - Abnormal; Notable for the following components:   WBC 2.9 (*)    RBC 3.75 (*)    Hemoglobin 10.2 (*)    HCT 31.7 (*)    Neutro Abs 1.2 (*)    All other components within normal limits  TROPONIN I (HIGH SENSITIVITY) - Abnormal; Notable for the following components:   Troponin I (High Sensitivity) 19 (*)    All other components within normal limits  TROPONIN I (HIGH SENSITIVITY) -  Abnormal; Notable for the following components:   Troponin I (High Sensitivity) 24 (*)    All other components within normal limits  RESP PANEL BY RT-PCR (FLU A&B, COVID) ARPGX2  BRAIN NATRIURETIC PEPTIDE  LEGIONELLA PNEUMOPHILA SEROGP 1 UR AG  PROCALCITONIN   EKG None  Radiology DG Chest 2 View  Result Date: 12/13/2020 CLINICAL DATA:  Chest pain.  Recent pneumonia diagnosis. EXAM: CHEST - 2 VIEW COMPARISON:  Chest x-ray 10/04/2020. FINDINGS: There is airspace disease throughout the inferior aspect of the right upper lobe and minimally in the right middle lobe. There is no pleural effusion or pneumothorax. The cardiomediastinal silhouette is within normal limits. There is a healed left eighth rib fracture. No acute fractures are seen. IMPRESSION: 1. Right upper lobe and right middle lobe airspace disease worrisome for multifocal pneumonia. Followup PA and lateral chest X-ray is recommended in 3-4 weeks following trial of antibiotic therapy to ensure resolution and exclude underlying malignancy. Electronically Signed   By: Darliss Cheney M.D.   On: 12/13/2020 19:15    Procedures Procedures   Medications Ordered in ED Medications  vancomycin (VANCOREADY) IVPB 500 mg/100 mL (has no administration in time range)  ceFEPIme (MAXIPIME) 2 g in sodium chloride 0.9 % 100 mL IVPB (has no administration in time range)  atorvastatin (LIPITOR) tablet 40 mg (40 mg Oral Given 12/14/20 1002)  carvedilol (COREG) tablet 3.125 mg (3.125 mg Oral Given 12/14/20 1003)  busPIRone (BUSPAR) tablet 5 mg (5 mg Oral Given 12/14/20 1002)  albuterol (PROVENTIL) (2.5 MG/3ML) 0.083% nebulizer solution 2.5 mg (has no administration in time range)  nicotine (NICODERM CQ - dosed in mg/24 hours) patch 14 mg (14 mg Transdermal Patch Applied 12/14/20 1003)  rivaroxaban (XARELTO) tablet 10 mg (10 mg Oral Given 12/14/20 1002)  sodium chloride flush (NS) 0.9 % injection 3 mL (3 mLs Intravenous Given 12/14/20 1004)  multivitamin  with minerals tablet 1 tablet (1 tablet Oral Given 12/14/20 1002)  thiamine tablet 100 mg (100 mg Oral Given 12/14/20 1002)  folic acid (FOLVITE) tablet 1 mg (1 mg Oral Given 12/14/20 1003)  sulfamethoxazole-trimethoprim (BACTRIM DS) 800-160 MG per tablet 2 tablet (has no administration in time range)  potassium chloride 10 mEq in 100 mL IVPB (0 mEq Intravenous Stopped 12/14/20 0919)  sodium chloride 0.9 % bolus 250 mL (0 mLs Intravenous Stopped 12/14/20 0919)  vancomycin (VANCOREADY) IVPB 1000 mg/200 mL (0 mg Intravenous Stopped 12/14/20 1120)  ceFEPIme (MAXIPIME) 2 g in sodium chloride 0.9 % 100 mL IVPB (0 g Intravenous Stopped 12/14/20 1004)  HYDROcodone-acetaminophen (NORCO/VICODIN) 5-325 MG per tablet 1 tablet (1 tablet Oral Given 12/14/20 1003)   ED Course  I have reviewed the triage vital signs and the nursing notes.  Pertinent labs & imaging results that were available during my care of the patient were reviewed by me and considered in my medical decision making (see chart for details).  Clinical Course as of 12/14/20 0734  Sun Dec 14, 2020  0730 [facility] estimated >1000 [SG]    Clinical Course User Index [SG] Sloan Leiter, DO   MDM Rules/Calculators/A&P 45 year old female who presents emergency department with chest pain and shortness of breath.  Chest Pain/Shortness of Breath  - EKG not ordered in triage, ordered as patient got back to the room.  - Initial troponin 19, repeat 24.  She has a known history of chronically elevated troponins from 20-30.   - Her presentation of symptoms is not consistent with ACS.  - Chest x-ray with appearance of right upper lobe and right middle lobe multifocal pneumonia.  She has a recent history of similar.  However I do not have those records as she was in [place]. - Given immunocompromise status and recent hospitalization she has high risk for multidrug-resistant pneumonia.  As such we will start her on broad-spectrum Vanco and cefepime. -  Ordered Legionella, COVID/flu  HIV/AIDS -WBC 2.9, hemoglobin 10.2, ANC 1200, ALC estimated >1000 -Last labs 10/2019, HIV quant 3000, CD4 476 -Not taking antiretrovirals -She does not note any new rashes, weight loss, adenopathy  While in the emergency department also repleting potassium (3.1), giving small 250 mL normal saline boluses for fluid resuscitation in the setting of known heart failure.  We will monitor lung sounds and oxygenation. Given patient's homelessness and concern for decompensation given her immunocompromise status will admit the patient for IV antibiotics and close monitoring.  I have spoken with Dr. Marchia Bond who agrees to admit the patient at this time. Final Clinical Impression(s) / ED Diagnoses Final diagnoses:  Pneumonia of right upper lobe due to infectious organism    Rx / DC Orders ED Discharge Orders     None        Cristopher Peru, PA-C 12/14/20 1404    Sloan Leiter, DO 12/14/20 1514

## 2020-12-14 NOTE — H&P (Addendum)
Date: 12/14/2020               Patient Name:  Alyssa Barnett MRN: [id number]  DOB: 11-Sep-1975 Age / Sex: 45 y.o., female   PCP: Patient, No Pcp Per (Inactive)         Medical Service: Internal Medicine Teaching Service         Attending Physician: Dr. Evette Doffing, Mallie Mussel, *    First Contact: Timothy Lasso, MD Pager: SA [phone]  Second Contact: Linwood Dibbles, MD Pager: PA [phone]       After Hours (After 5p/  First Contact Pager: [phone]  weekends / holidays): Second Contact Pager: [phone]   SUBJECTIVE  Chief Complaint: Chest pain  History of Present Illness: Alyssa Barnett is a 45 y.o. female with a pertinent PMH of HIV, HBV, HFrEF (EF 30%), and HTN, who presents to [facility] with chest pain.  She states that she was in [place] visiting family when she developed a pneumonia. She was hospitalized there for 2 weeks. Recently discharged last Tuesday. She states that she was feeling better but continued to have chest pain at the time of discharged. Since then, her symptoms have progressed with worsening right-sided chest pain. She states that she feels like her pneumonia is coming back. She endorses subjective night time fevers and sweats but unsure when this started. She has a chronic cough that she attributes to her history of smoking and CHF. She also has [facility] but states that this is at baseline and does not limit her activities.    She has had multiple recent ED visits in [place] at at [facility] and [place] for Chest Pain and [facility]. She also has previously expressed  SA in the setting of recently becoming un homed, but denies HA/SA currently.    She has a significant hsitory of HIV/AIDs and states that she has been off of  ARVT for almost a year. She recently re-established care at an ID office in [place] but has not restarted meds due to financial limitations. She is currently working on getting financial assistance for ARVT. She denies any  abdominal pain, headache, sore throat, rhinorrhea, dysuria, substance use/IVDU or change in bowel habits.   Medications: No current facility-administered medications on file prior to encounter.   Current Outpatient Medications on File Prior to Encounter  Medication Sig Dispense Refill   Albuterol Sulfate (PROAIR RESPICLICK) 123XX123 (90 Base) MCG/ACT AEPB Inhale 2 puffs into the lungs every 4 (four) hours as needed.     atorvastatin (LIPITOR) 40 MG tablet Take 40 mg by mouth daily.     busPIRone (BUSPAR) 5 MG tablet Take 5 mg by mouth in the morning and at bedtime.     carvedilol (COREG) 3.125 MG tablet Take 3.125 mg by mouth 2 (two) times daily.     furosemide (LASIX) 40 MG tablet Take 1 tablet (40 mg total) by mouth daily. 45 tablet 0   aspirin EC 81 MG EC tablet Take 1 tablet (81 mg total) by mouth daily. Swallow whole. (Patient not taking: Reported on 12/14/2020) 30 tablet 11   Darunavir-Cobicisctat-Emtricitabine-Tenofovir Alafenamide (SYMTUZA) 800-150-200-10 MG TABS Take 1 tablet by mouth daily with breakfast. (Patient not taking: No sig reported) 30 tablet 0   nicotine (NICODERM CQ - DOSED IN MG/24 HOURS) 14 mg/24hr patch Place 1 patch (14 mg total) onto the skin daily. (Patient not taking: No sig reported) 28 patch 0    Past Medical History: Past Medical History:  Diagnosis Date   ADHD    Bacterial vaginosis    Hepatitis B    HIV (human immunodeficiency virus infection) (HCC)    Hypertension    Pneumonia    PTSD (post-traumatic stress disorder)    Substance use disorder     Social:  Lives - In [place], but currently unhomed. Family lives in [place]  Occupation - unemployed Support - She denies any friends or family living near by Level of function - independent PCP - unknown Substance use - denies substance use. She endorse tobacco use (5 cig per day for many years)  Family History: Family History  Problem Relation Age of Onset   Diabetes Father    CAD Father      Allergies: Allergies as of 12/13/2020 - Review Complete 10/04/2020  Allergen Reaction Noted   Flagyl [metronidazole] Nausea And Vomiting 10/18/2019   Cefaclor Itching 01/17/2019    Review of Systems: A complete ROS was negative except as per HPI.   OBJECTIVE:  Physical Exam: Blood pressure 134/75, pulse 67, temperature 97.7 F (36.5 C), temperature source Oral, resp. rate (!) 24, height 5\' 4"  (1.626 m), weight 49.9 kg, last menstrual period 09/30/2019, SpO2 98 %. Physical Exam Constitutional:      General: She is not in acute distress.    Appearance: She is ill-appearing (chronic).  HENT:     Head: Normocephalic and atraumatic.     Nose: Nose normal.     Mouth/Throat:     Mouth: Mucous membranes are moist.     Pharynx: Oropharynx is clear.  Eyes:     Extraocular Movements: Extraocular movements intact.  Cardiovascular:     Rate and Rhythm: Normal rate and regular rhythm.     Pulses: Normal pulses.     Heart sounds: No murmur heard. Pulmonary:     Effort: Pulmonary effort is normal.     Breath sounds: Rales (minimal right sided rales) present. No wheezing.  Chest:     Chest wall: Tenderness (mild rightsided chest tenderness) present.  Abdominal:     General: Abdomen is flat. There is no distension.     Palpations: Abdomen is soft.     Tenderness: There is no abdominal tenderness.  Musculoskeletal:        General: No swelling. Normal range of motion.     Cervical back: Normal range of motion. No rigidity.  Skin:    General: Skin is warm and dry.  Neurological:     General: No focal deficit present.     Mental Status: She is alert and oriented to person, place, and time. Mental status is at baseline.  Psychiatric:        Mood and Affect: Mood normal.        Thought Content: Thought content normal.        Judgment: Judgment normal.    Pertinent Labs: CBC    Component Value Date/Time   WBC 2.9 (L) 12/13/2020 1759   RBC 3.75 (L) 12/13/2020 1759   HGB 10.2  (L) 12/13/2020 1759   HCT 31.7 (L) 12/13/2020 1759   PLT 178 12/13/2020 1759   MCV 84.5 12/13/2020 1759   MCH 27.2 12/13/2020 1759   MCHC 32.2 12/13/2020 1759   RDW 15.1 12/13/2020 1759   LYMPHSABS 1.1 12/13/2020 1759   MONOABS 0.5 12/13/2020 1759   EOSABS 0.1 12/13/2020 1759   BASOSABS 0.0 12/13/2020 1759     CMP     Component Value Date/Time   NA 133 (L) 12/14/2020 12/16/2020  K 3.6 12/14/2020 0736   CL 102 12/14/2020 0736   CO2 24 12/14/2020 0736   GLUCOSE 93 12/14/2020 0736   BUN 15 12/14/2020 0736   CREATININE 1.44 (H) 12/14/2020 0736   CALCIUM 7.8 (L) 12/14/2020 0736   PROT 7.1 12/14/2020 0736   ALBUMIN 2.4 (L) 12/14/2020 0736   AST 28 12/14/2020 0736   ALT 15 12/14/2020 0736   ALKPHOS 66 12/14/2020 0736   BILITOT 0.7 12/14/2020 0736   GFRNONAA 46 (L) 12/14/2020 0736   GFRAA >60 10/22/2019 0451    Pertinent Imaging: DG Chest 2 View Result Date: 12/13/2020 1. Right upper lobe and right middle lobe airspace disease worrisome for multifocal pneumonia. Followup PA and lateral chest X-ray is recommended in 3-4 weeks following trial of antibiotic therapy to ensure resolution and exclude underlying malignancy.   EKG: personally reviewed my interpretation is - regular rate and rhythm, normal axis, and evidence of LVH   ASSESSMENT & PLAN:  Assessment: Active Problems:   Acquired immunodeficiency syndrome (AIDS) with bacterial pneumonia ([place])   New onset of congestive heart failure (HCC)   Heart failure, left, with LVEF <=30% (HCC)   Multifocal pneumonia   Alyssa Barnett is a 45 y.o. with pertinent PMH of HIV, HBV, HFrEF (EF 30%), and HTN who presented with chest pain and admit for multifocal pneumonia on hospital day 0  Plan: #Multifocal pneumonia Patient presents with leukopenia, subjective fevers/hypothermic, and CXR findings concerning for multifocal pneumonia.  Her biggest risk factors for atypical pneumonias include her history of HIV/AIDS of antiretrovirals and  recent hospitalization and antibiotic use.  I do not see any documented history of MRSA infections and she denies any substance use/IVDU. She is at risk for PCP pneumonia and does endorse at least 1 episode of night sweats and subjective fever increasing risk of possible TB infection.  However, her chest x-ray does not show evidence of cavitary lung lesions typical of tuberculosis . Her vitals are stable and she is saturating well on RA. She was started on Vanco and cefepime in the ED. Will aim to de-escalate antibiotics in the near future. - Procalcitonin, Legionella Ag, and Step pneumo Ag - COVID/Influenza PCR and Resp Ciral PCR pending - Consider BAL with PCP PCR.  - MRSA PCR pending - Bcx pending - CD4 count pending, will need to add bactrim to cover PCP pneumonia particularly if CD4 < 250 - Continue Vanc and Cefepime, will likely de-escalate to ceftriaxone, azithromycin, Bactrim  #Leukopenia: Patient has a leukopenia likely secondary to untreated HIV infection.  She is not on any prophylactic antibiotics at this time.  She is currently on homed with financial challenges making nutrition a challenge.  She denies any significant substance use that could be contributing to her blood count abnormalities. - CBC with dif pending - Reticulocyte count pending - Blood smear pending   #Acute Kidney Injury   Cr of 1.70 and GFR of 38 with a baseline of 1.0 and GFR >60. Sdhe has a BUN/Cr ration of 21 making prerenal likely. This is consistent with her physical exam as she appears dry on exam.  -UA pending - Encourage PO fluid intake  - Strict INO and monitor kidney function daily  #HIV/AIDs Patient has not been on antiretrovirals for over a year.  Recently established care with an ID doctor in [place].  Was represcribed medications but was unable to financially afford them.  Waiting to get financial assistance for these medications.  She does not have any recent  CD4 or RNA levels no recent CD4  count -  CD4 count and HIV RNA level pending - Will need bactrim ppx if CD4 count < 250 - TOC consulted for assistance.  - Cont atorvastatin 40 mg daily (make sure this does not interact with her HIV meds)   #HFrEF BNP normal at 71 Echo from 10/2019 shows LVEF of 25-30%, mild LV dilation, Grade II diastolic dysfunction with moderately reduced RV function and mild hypertrophy.  Currently she appears euvolemic since/hypervolemic on exam without any significant signs or symptoms of heart failure.  She denies physical limitations due to her heart failure currently.  She is on furosemide and carvedilol at home for this and states that she is taking it. - Continue Carvedilol 3.125 mg BID - Hold furosemide today  #HBV Patient has a history of chronic HBV infection. She previously had a positive HBV core antibody and surface antigen with a negative surface AB. She denies recent history of being treated for HBV, but was recently referred to HBV clinic at [facility]  #Normocytic anemia: Patient has a new normocytic anemia on CBC.  Is likely multifactorial but has not been worked up. -CBC with differential pending -Reticulocyte pending -B12 and folate pending -Iron and TIBC pending   #PTSD Recently seen at [facility] with SA. She has been on buspirone in the OP setting and states that this works well for her.  - Cont buspirone twice daily  Best Practice: Diet: Cardiac diet IVF: Fluids: none, Rate: None VTE: rivaroxaban (XARELTO) tablet 10 mg Start: 12/14/20 1000DOAC  Code: Full AB: Vanc and cefepime Status: Observation with expected length of stay less than 2 midnights. Anticipated Discharge Location:  pending TOC Barriers to Discharge: IV antibiotics  Signature: Lawerance Cruel, D.O.  Internal Medicine Resident, PGY-3 [facility]  Pager: [phone] 9:50 AM, 12/14/2020   Please contact the on call pager after 5 pm and on weekends at [phone].

## 2020-12-15 DIAGNOSIS — E440 Moderate protein-calorie malnutrition: Secondary | ICD-10-CM | POA: Insufficient documentation

## 2020-12-15 LAB — T-HELPER CELLS (CD4) COUNT (NOT AT ARMC)
CD4 % Helper T Cell: 13 % — ABNORMAL LOW (ref 33–65)
CD4 T Cell Abs: 134 /uL — ABNORMAL LOW (ref 400–1790)

## 2020-12-15 MED ORDER — HYDROXYZINE HCL 10 MG PO TABS
10.0000 mg | ORAL_TABLET | Freq: Three times a day (TID) | ORAL | Status: DC | PRN
  Filled 2020-12-15: qty 1

## 2020-12-15 MED ORDER — CLONAZEPAM 0.25 MG PO TBDP
0.2500 mg | ORAL_TABLET | Freq: Two times a day (BID) | ORAL | Status: DC
  Filled 2020-12-15: qty 1

## 2020-12-15 MED ORDER — ENSURE ENLIVE PO LIQD
237.0000 mL | Freq: Three times a day (TID) | ORAL | Status: DC
  Administered 2020-12-15 – 2020-12-17 (×5): 237 mL via ORAL

## 2020-12-15 MED ORDER — TRAMADOL HCL 50 MG PO TABS: 50.0000 mg | ORAL_TABLET | Freq: Two times a day (BID) | ORAL | Status: DC | PRN

## 2020-12-15 MED ORDER — KETOROLAC TROMETHAMINE 15 MG/ML IJ SOLN
7.5000 mg | Freq: Three times a day (TID) | INTRAMUSCULAR | Status: AC | PRN
  Administered 2020-12-15 – 2020-12-16 (×2): 7.5 mg via INTRAVENOUS
  Filled 2020-12-15 (×2): qty 1

## 2020-12-15 MED ORDER — TRAMADOL HCL 50 MG PO TABS
50.0000 mg | ORAL_TABLET | Freq: Two times a day (BID) | ORAL | Status: DC | PRN
  Administered 2020-12-15: 50 mg via ORAL
  Filled 2020-12-15: qty 1

## 2020-12-15 NOTE — Progress Notes (Signed)
Subjective: I seen and evaluated Ms. Alyssa Barnett at bedside.  She states that she is experiencing pleuritic chest pain.  She denies fever, chills, N/V, constipation or diarrhea.  Otherwise, patient expresses concern for housing because she states she is currently homeless.  Objective:  Vital signs in last 24 hours: Vitals:   12/14/20 2044 12/15/20 0612 12/15/20 0748 12/15/20 1036  BP: 134/81 128/77 (!) 149/90 121/85  Pulse: 66 73 60 69  Resp: 18 19 17    Temp: (!) 97.4 F (36.3 C) 98.1 F (36.7 C) 98.1 F (36.7 C)   TempSrc: Oral Oral Oral   SpO2: 100% 98% 99%   Weight:      Height:       Physical Exam HENT:     Head: Normocephalic and atraumatic.  Cardiovascular:     Rate and Rhythm: Normal rate and regular rhythm.     Heart sounds: Normal heart sounds.  Pulmonary:     Effort: Pulmonary effort is normal.     Breath sounds: Rales present.  Musculoskeletal:     Right lower leg: No edema.     Left lower leg: No edema.  Skin:    General: Skin is warm and dry.  Neurological:     General: No focal deficit present.     Mental Status: She is alert.  Psychiatric:        Behavior: Behavior is cooperative.     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  Denies productive cough at this time.  Denies nausea/vomiting, constipation or diarrhea.  COVID, flu, respiratory panel all negative.  MRSA negative.  Blood cultures show no growth so far; CD4 <200. 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  Patient has not been on any antivirals for over  a year. Reticulocyte count within normal limits - Blood smear pending    #Acute Kidney Injury  Creatinine function is improving currently 1.44 down from yesterday Cr of 1.70. UA-negative -Hold furosemide - Encourage PO fluid intake  - Strict I&O - Daily BMP   #HIV/AIDs Patient has not been on antiretrovirals (Symtuza) for over a year. Patient found to have CD4<200 (CD4 count 134).  She is at risk for opportunistic infections such as PCP, toxo, Bartonella, crypto, histo, coccidio infections.  Patient also is leukopenic with multifocal pneumonia; MRSA negative. --HIV viral load pending --Vanco discontinued --Continue Bactrim prophylaxis --Continue cefepime   #HFrEF Furosemide held in the setting of acute kidney injury.  On exam, patient appears euvolemic.  No lower extremity edema present. Left lower lung field- rales present, likely due to consolidation 2/2 pneumonia; decreased breath sounds throughout. 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  --refer to infectious disease outpatient  #Normocytic anemia Patient has a new normocytic anemia on CBC, is likely multifactorial. Iron studies, vitamin B12, folate, reticulocyte count all within normal limits  #PTSD #Anxiety She has been on buspirone in the OP setting and states that this works well for her.  --Continue buspirone twice daily --Klonopin 0.25 twice daily --Hydroxyzine 10 mg as needed  Prior to Admission Living Arrangement: Anticipated Discharge Location: Barriers to Discharge: Dispo: Anticipated discharge in approximately 1-2 day(s).   Dellis Filbert, MD 12/15/2020, 2:25 PM Pager: [phone] After 5pm on weekdays and 1pm on weekends: On Call pager [phone]

## 2020-12-15 NOTE — Progress Notes (Signed)
Initial Nutrition Assessment  DOCUMENTATION CODES:  Non-severe (moderate) malnutrition in context of chronic illness  INTERVENTION:  Add Ensure Plus High Protein po TID, each supplement provides 350 kcal and 20 grams of protein.   Continue MVI with minerals daily.  Encourage PO and supplement intake.  NUTRITION DIAGNOSIS:  Moderate Malnutrition related to chronic illness (HIV) as evidenced by moderate fat depletion, moderate muscle depletion.  GOAL:  Patient will meet greater than or equal to 90% of their needs  MONITOR:  PO intake, Supplement acceptance, Labs, Weight trends, I & O's  REASON FOR ASSESSMENT:  Malnutrition Screening Tool    ASSESSMENT:  45 yo female with a PMH of HIV, HBV, HFrEF (EF 30%), and HTN, who presents to [facility] with chest pain. Admitted with multifocal PNA.  Per Epic, pt ate 100% of dinner last night.  Spoke with pt at bedside. Pt eating lunch at the time of RD visit. She reports having a good appetite and was eating consisently PTA.  She also reports a bit of weight loss recently. Limited history in Epic and Care Everywhere. Upon investigation, pt's weight appears to remain between 100-110 lbs.   Recommend adding Ensure TID and MVI with minerals.   NUTRITION - FOCUSED PHYSICAL EXAM: Flowsheet Row Most Recent Value  Orbital Region Mild depletion  Upper Arm Region Moderate depletion  Thoracic and Lumbar Region No depletion  Buccal Region Mild depletion  Temple Region Mild depletion  Clavicle Bone Region Moderate depletion  Clavicle and Acromion Bone Region Moderate depletion  Scapular Bone Region Mild depletion  Dorsal Hand Moderate depletion  Patellar Region Mild depletion  Anterior Thigh Region Mild depletion  Posterior Calf Region Mild depletion  Edema (RD Assessment) None  Hair Reviewed  Eyes Reviewed  Mouth Reviewed  Skin Reviewed  Nails Reviewed   Diet Order:   Diet Order             Diet Heart Room service appropriate? Yes; Fluid  consistency: Thin  Diet effective now                  EDUCATION NEEDS:  Education needs have been addressed  Skin:  Skin Assessment: Reviewed RN Assessment  Last BM:  12/14/20  Height:  Ht Readings from Last 1 Encounters:  12/14/20 5\' 4"  (1.626 m)   Weight:  Wt Readings from Last 1 Encounters:  12/14/20 49.9 kg   BMI:  Body mass index is 18.88 kg/m.  Estimated Nutritional Needs:  Kcal:  2100-2300 Protein:  75-90 grams Fluid:  >2.1 L  12/16/20, RD, LDN (she/her/hers) Clinical Inpatient Dietitian RD Pager/After-Hours/Weekend Pager # in [place]

## 2020-12-15 NOTE — TOC Initial Note (Addendum)
Transition of Care [facility]) - Initial/Assessment Note    Patient Details  Name: Alyssa Barnett MRN: [id number] Date of Birth: [birth year]  Transition of Care [facility]) CM/SW Contact:    Kingsley Plan, RN Phone Number: 12/15/2020, 10:14 AM  Clinical Narrative:                 Spoke to patient at bedside. Patient states she is homeless. Does not have any family or friends in the area.   NCM offered shelter information, patient declined states she has received information from ED . She is interested in going to [facility] in [place]. Patient called yesterday and was told they had a bed yesterday but it is first come first serve.   NCM messaged MD to see if anticipated discharge date is known.   In [facility] patient has Medicaid of [place], patient stated she was unaware of having [place] Medicaid . Patient told NCM to call the infectious disease clinic at [facility] "they are always getting me into things".  Consult for substance abuse , patient denies any substance use.   NCM provided patient with transportation resources, including [employer] and [employer]    NCM called [facility] [phone] and spoke to [place]. Per Annice Pih patient goes regularly there to her appointments and has an appointment with Dr Jinny Sanders December 22, 2020 at 0840 am.   Patient has assistance with her HIV medications through the [facility]. Patient does have [place] Medicaid and her PCP is Victorino Dike [phone]. NCM called [phone] they are nephrology and patient does not see any of their providers. Was directed back to infectious disease.   They last address they have for [place] is : [address]    Changed pharmacy to [facility]   Expected Discharge Plan: Homeless Shelter     Patient Goals and CMS Choice        Expected Discharge Plan and Services Expected Discharge Plan: Homeless Shelter   Discharge Planning Services: CM  Consult                       DME Agency: NA                  Prior Living Arrangements/Services   Lives with:: Self Patient language and need for interpreter reviewed:: Yes        Need for Family Participation in Patient Care: No (Comment) Care giver support system in place?: No (comment)   Criminal Activity/Legal Involvement Pertinent to Current Situation/Hospitalization: No - Comment as needed  Activities of Daily Living Home Assistive Devices/Equipment: None ADL Screening (condition at time of admission) Patient's cognitive ability adequate to safely complete daily activities?: Yes Is the patient deaf or have difficulty hearing?: No Does the patient have difficulty seeing, even when wearing glasses/contacts?: No Does the patient have difficulty concentrating, remembering, or making decisions?: No Patient able to express need for assistance with ADLs?: Yes Does the patient have difficulty dressing or bathing?: No Independently performs ADLs?: Yes (appropriate for developmental age) Communication: Independent Dressing (OT): Independent Grooming: Independent Feeding: Independent Bathing: Independent Toileting: Independent In/Out Bed: Independent Walks in Home: Independent Does the patient have difficulty walking or climbing stairs?: No Weakness of Legs: None Weakness of Arms/Hands: None  Permission Sought/Granted   Permission granted to share information with : No  Emotional Assessment Appearance:: Appears stated age Attitude/Demeanor/Rapport: Guarded Affect (typically observed): Blunt Orientation: : Oriented to Self, Oriented to Place, Oriented to  Time, Oriented to Situation      Admission diagnosis:  Pneumonia of right upper lobe due to infectious organism [J18.9] Multifocal pneumonia [J18.9] Patient Active Problem List   Diagnosis Date Noted   Multifocal pneumonia 12/14/2020   Fall    Heart failure, left, with LVEF <=30% (HCC)     Acquired immunodeficiency syndrome (AIDS) with bacterial pneumonia (HCC) 10/16/2019   New onset of congestive heart failure (HCC) 10/16/2019   Polysubstance abuse (HCC) 10/16/2019   Tobacco dependence 10/16/2019   PCP:  Patient, No Pcp Per (Inactive) Pharmacy:   [facility] [address] [place] [address] Phone: [phone] Fax: [phone]     Social Determinants of Health (SDOH) Interventions    Readmission Risk Interventions No flowsheet data found.

## 2020-12-15 NOTE — TOC CAGE-AID Note (Signed)
Transition of Care [facility]) - CAGE-AID Screening   Patient Details  Name: Alyssa Barnett MRN: [id number] Date of Birth: [birth year]  Transition of Care [facility]) CM/SW Contact:    Kingsley Plan, RN Phone Number: 12/15/2020, 10:03 AM   Clinical Narrative:  Patient denies substance use   CAGE-AID Screening:               Substance Abuse Education Offered: Yes

## 2020-12-16 DIAGNOSIS — J159 Unspecified bacterial pneumonia: Principal | ICD-10-CM

## 2020-12-16 DIAGNOSIS — I509 Heart failure, unspecified: Secondary | ICD-10-CM

## 2020-12-16 LAB — BASIC METABOLIC PANEL
Anion gap: 11 (ref 5–15)
BUN: 25 mg/dL — ABNORMAL HIGH (ref 6–20)
CO2: 16 mmol/L — ABNORMAL LOW (ref 22–32)
Calcium: 8 mg/dL — ABNORMAL LOW (ref 8.9–10.3)
Chloride: 104 mmol/L (ref 98–111)
Creatinine, Ser: 1.54 mg/dL — ABNORMAL HIGH (ref 0.44–1.00)
GFR, Estimated: 43 mL/min — ABNORMAL LOW (ref >60)
Glucose, Bld: 76 mg/dL (ref 70–99)
Potassium: 5 mmol/L (ref 3.5–5.1)
Sodium: 131 mmol/L — ABNORMAL LOW (ref 135–145)

## 2020-12-16 LAB — STREP PNEUMONIAE URINARY ANTIGEN: Strep Pneumo Urinary Antigen: POSITIVE — AB

## 2020-12-16 LAB — HIV-1 RNA QUANT-NO REFLEX-BLD
HIV 1 RNA Quant: 150000 copies/mL
LOG10 HIV-1 RNA: 5.176 log10copy/mL

## 2020-12-16 LAB — PATHOLOGIST SMEAR REVIEW

## 2020-12-16 MED ORDER — SODIUM CHLORIDE 0.9 % IV SOLN: INTRAVENOUS | Status: DC

## 2020-12-16 MED ORDER — KETOROLAC TROMETHAMINE 15 MG/ML IJ SOLN
15.0000 mg | Freq: Three times a day (TID) | INTRAMUSCULAR | Status: DC | PRN
  Administered 2020-12-16 – 2020-12-17 (×2): 15 mg via INTRAVENOUS
  Filled 2020-12-16 (×2): qty 1

## 2020-12-16 MED ORDER — SULFAMETHOXAZOLE-TRIMETHOPRIM 800-160 MG PO TABS
1.0000 | ORAL_TABLET | Freq: Every day | ORAL | Status: DC
  Administered 2020-12-17: 1 via ORAL
  Filled 2020-12-16: qty 1

## 2020-12-16 NOTE — Progress Notes (Signed)
Subjective: I seen and evaluated Ms. Stevick at bedside. She states she experienced one episode of productive cough, producing small amount of brownish sputum. She reports continued pleuritic chest pain. Otherwise, denies any other complaints.   Objective:  Vital signs in last 24 hours: Vitals:   12/15/20 1813 12/15/20 2053 12/16/20 0523 12/16/20 0749  BP: (!) 145/91 (!) 141/83 131/88 131/82  Pulse: 67 71 66 61  Resp: 18 18 17 19   Temp: 97.7 F (36.5 C) 97.7 F (36.5 C) 97.6 F (36.4 C) 97.7 F (36.5 C)  TempSrc: Oral Oral Oral Oral  SpO2: 99% 97% 100% 99%  Weight:      Height:       Physical Exam Constitutional:      General: She is awake.  HENT:     Head: Normocephalic and atraumatic.  Eyes:     General: Lids are normal.  Cardiovascular:     Rate and Rhythm: Normal rate and regular rhythm.     Heart sounds: Normal heart sounds.  Pulmonary:     Breath sounds: Normal breath sounds.  Abdominal:     General: Abdomen is flat.  Musculoskeletal:     Right lower leg: No edema.     Left lower leg: No edema.  Lymphadenopathy:     Head:     Right side of head: No submental, submandibular or posterior auricular adenopathy.     Left side of head: No submental, submandibular or posterior auricular adenopathy.     Cervical: No cervical adenopathy.     Right cervical: No superficial, deep or posterior cervical adenopathy.    Left cervical: No superficial, deep or posterior cervical adenopathy.     Upper Body:     Right upper body: No supraclavicular adenopathy.     Left upper body: No supraclavicular adenopathy.  Skin:    General: Skin is warm and dry.  Neurological:     General: No focal deficit present.     Mental Status: She is alert.  Psychiatric:        Behavior: Behavior normal. Behavior is cooperative.     Assessment/Plan:  Active Problems:   Acquired immunodeficiency syndrome (AIDS) with bacterial pneumonia ([place])   New onset of congestive heart failure (HCC)    Heart failure, left, with LVEF <=30% (HCC)   Multifocal pneumonia   Malnutrition of moderate degree   Multifocal pneumonia Patient reports one episode of cough productive of brownish phlegm. Patient pleuritic chest pain continues and states Toradol relieved the pain. On PE, lungs sounds clear on ascultation, no increased work of breathing noted. No 02 requirement. 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  Patient has not been on any antivirals for over a year. Reticulocyte count within normal limits; Blood smear- unremarkable - Daily CBC   #Acute Kidney Injury   Cr function slightly worsened overnight. Currently 1.54 (up from 1.44). Patient received Toradol overnight for pleuritic chest pain; possible culprit for worsening kidney function. -Hold furosemide - Encourage PO fluid intake  - Strict I&O - Daily BMP   #HIV/AIDs Patient has not been on antiretrovirals (Symtuza) for over a year. Patient found to have CD4<200 (CD4 count 134).  She is at risk for opportunistic infections such as PCP, toxo, Bartonella, crypto, histo, coccidio infections.  Patient also is leukopenic with multifocal pneumonia; MRSA negative. --HIV viral load pending --Continue Bactrim  prophylaxis --Continue cefepime --ID on board; RPR/Urine cytology/GC/Chlamydia pending   #HFrEF Furosemide held in the setting of acute kidney injury.  On exam, patient appears euvolemic.  No lower extremity edema present. Lungs sounds clear on ascultation. Patient denies SOB. 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  --refer to infectious disease outpatient   #Normocytic anemia Patient has a new normocytic anemia on CBC, is likely multifactorial. Iron studies, vitamin B12, folate,  reticulocyte count all within normal limits   #PTSD #Anxiety Patient refused klonopin and hydroxyzine overnight and states it "hurts" her stomach. Will discontinue both medications.  --Continue buspirone twice daily --Discontinue Klonopin 0.25 and Hydroxyzine      Prior to Admission Living Arrangement: Anticipated Discharge Location: Barriers to Discharge: Dispo: Anticipated discharge in approximately 1-2 day(s).   Dellis Filbert, MD 12/16/2020, 10:18 AM Pager: [phone] After 5pm on weekdays and 1pm on weekends: On Call pager [phone]

## 2020-12-16 NOTE — Plan of Care (Signed)
  Problem: Education: Goal: Knowledge of General Education information will improve Description: Including pain rating scale, medication(s)/side effects and non-pharmacologic comfort measures Outcome: Progressing   Problem: Clinical Measurements: Goal: Diagnostic test results will improve Outcome: Progressing   Problem: Clinical Measurements: Goal: Respiratory complications will improve Outcome: Progressing   Problem: Activity: Goal: Risk for activity intolerance will decrease Outcome: Progressing   Problem: Nutrition: Goal: Adequate nutrition will be maintained Outcome: Progressing   Problem: Coping: Goal: Level of anxiety will decrease Outcome: Progressing   Problem: Pain Managment: Goal: General experience of comfort will improve Outcome: Progressing   Problem: Safety: Goal: Ability to remain free from injury will improve Outcome: Progressing

## 2020-12-16 NOTE — Progress Notes (Signed)
Mobility Specialist Progress Note:   12/16/20 1100  Mobility  Activity Ambulated in hall  Level of Assistance Independent  Assistive Device None  Distance Ambulated (ft) 255 ft  Mobility Ambulated independently in hallway  Mobility Response Tolerated well  Mobility performed by Mobility specialist  $Mobility charge 1 Mobility   Pt displayed SOB during ambulation. Pt desat to 85% on RA during ambulation, quickly recovered to 94% with pursed lip breathing.   Addison Lank Mobility Specialist  Phone [phone]
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
## 2020-12-17 ENCOUNTER — Other Ambulatory Visit (HOSPITAL_COMMUNITY): Payer: Self-pay

## 2020-12-17 LAB — BASIC METABOLIC PANEL
Anion gap: 7 (ref 5–15)
BUN: 25 mg/dL — ABNORMAL HIGH (ref 6–20)
CO2: 16 mmol/L — ABNORMAL LOW (ref 22–32)
Calcium: 8.1 mg/dL — ABNORMAL LOW (ref 8.9–10.3)
Chloride: 110 mmol/L (ref 98–111)
Creatinine, Ser: 1.36 mg/dL — ABNORMAL HIGH (ref 0.44–1.00)
GFR, Estimated: 50 mL/min — ABNORMAL LOW (ref >60)
Glucose, Bld: 115 mg/dL — ABNORMAL HIGH (ref 70–99)
Potassium: 4.1 mmol/L (ref 3.5–5.1)
Sodium: 133 mmol/L — ABNORMAL LOW (ref 135–145)

## 2020-12-17 LAB — GC/CHLAMYDIA PROBE AMP (~~LOC~~) NOT AT ARMC
Chlamydia: NEGATIVE
Comment: NEGATIVE
Comment: NORMAL
Neisseria Gonorrhea: NEGATIVE

## 2020-12-17 LAB — URINE CYTOLOGY ANCILLARY ONLY
Chlamydia: NEGATIVE
Comment: NEGATIVE
Comment: NORMAL
Neisseria Gonorrhea: NEGATIVE

## 2020-12-17 LAB — RPR: RPR Ser Ql: NONREACTIVE

## 2020-12-17 MED ORDER — FUROSEMIDE 40 MG PO TABS
40.0000 mg | ORAL_TABLET | Freq: Every day | ORAL | 1 refills | Status: AC
  Filled 2020-12-17: qty 90, 90d supply, fill #0

## 2020-12-17 MED ORDER — LIDOCAINE 5 % EX PTCH
1.0000 | MEDICATED_PATCH | CUTANEOUS | 0 refills | Status: AC
  Filled 2020-12-17: qty 30, 30d supply, fill #0

## 2020-12-17 MED ORDER — ATORVASTATIN CALCIUM 40 MG PO TABS
40.0000 mg | ORAL_TABLET | Freq: Every day | ORAL | 1 refills | Status: AC
  Filled 2020-12-17: qty 90, 90d supply, fill #0

## 2020-12-17 MED ORDER — BICTEGRAVIR-EMTRICITAB-TENOFOV 50-200-25 MG PO TABS
1.0000 | ORAL_TABLET | Freq: Every day | ORAL | 0 refills | Status: AC
  Filled 2020-12-17: qty 30, 30d supply, fill #0

## 2020-12-17 MED ORDER — BICTEGRAVIR-EMTRICITAB-TENOFOV 50-200-25 MG PO TABS
1.0000 | ORAL_TABLET | Freq: Every day | ORAL | Status: DC
Start: 2020-12-17 — End: 2020-12-17
  Filled 2020-12-17 (×2): qty 1

## 2020-12-17 MED ORDER — BUSPIRONE HCL 5 MG PO TABS
5.0000 mg | ORAL_TABLET | Freq: Two times a day (BID) | ORAL | 0 refills | Status: AC
Start: 2020-12-17 — End: 2021-03-17
  Filled 2020-12-17: qty 60, 30d supply, fill #0

## 2020-12-17 MED ORDER — CARVEDILOL 3.125 MG PO TABS
3.1250 mg | ORAL_TABLET | Freq: Two times a day (BID) | ORAL | 1 refills | Status: AC
  Filled 2020-12-17: qty 180, 90d supply, fill #0

## 2020-12-17 MED ORDER — SULFAMETHOXAZOLE-TRIMETHOPRIM 800-160 MG PO TABS
1.0000 | ORAL_TABLET | Freq: Every day | ORAL | 0 refills | Status: AC
  Filled 2020-12-17: qty 30, 30d supply, fill #0

## 2020-12-17 MED ORDER — LIDOCAINE 5 % EX PTCH: 1.0000 | MEDICATED_PATCH | CUTANEOUS | Status: DC

## 2020-12-17 NOTE — Discharge Summary (Signed)
Name: Alyssa Barnett MRN: [id number] DOB: August 21, 1975 45 y.o. PCP: Patient, No Pcp Per (Inactive)  Date of Admission: 12/13/2020  5:52 PM Date of Discharge: 12/17/20 Attending Physician: Miguel Aschoff, MD  Discharge Diagnosis: 1.  Multifocal pneumonia 2.  HIV 3.  Leukopenia 4.  HFrEF 5.  Acute kidney injury  Discharge Medications: Allergies as of 12/17/2020       Reactions   Flagyl [metronidazole] Nausea And Vomiting   Pt states severe, violent vomiting.   Cefaclor Itching   Pork-derived Products    Other reaction(s): Other (See Comments) Pt states she has a religious objection to consuming pork        Medication List     STOP taking these medications    aspirin 81 MG EC tablet   Symtuza 800-150-200-10 MG Tabs Generic drug: Darunavir-Cobicistat-Emtricitabine-Tenofovir Alafenamide       TAKE these medications    Albuterol Sulfate 108 (90 Base) MCG/ACT Aepb Commonly known as: PROAIR RESPICLICK Inhale 2 puffs into the lungs every 4 (four) hours as needed.   atorvastatin 40 MG tablet Commonly known as: Lipitor Take 1 tablet (40 mg total) by mouth daily.   Biktarvy 50-200-25 MG Tabs tablet Generic drug: bictegravir-emtricitabine-tenofovir AF Take 1 tablet by mouth daily.   busPIRone 5 MG tablet Commonly known as: BUSPAR Take 1 tablet (5 mg total) by mouth 2 (two) times daily. What changed: when to take this   carvedilol 3.125 MG tablet Commonly known as: Coreg Take 1 tablet (3.125 mg total) by mouth 2 (two) times daily.   furosemide 40 MG tablet Commonly known as: Lasix Take 1 tablet (40 mg total) by mouth daily.   lidocaine 5 % Commonly known as: LIDODERM Place 1 patch onto the skin daily. Remove & Discard patch within 12 hours or as directed by MD Start taking on: December 18, 2020   sulfamethoxazole-trimethoprim 800-160 MG tablet Commonly known as: BACTRIM DS Take 1 tablet by mouth daily.        Disposition and follow-up:    Ms.Alyssa Barnett was discharged from [facility] in Stable condition.  At the hospital follow up visit please address:  1.  Multifocal pneumonia- use lidocaine patches for pleuritic chest pain as needed 2.  HIV-continue taking Biktarvy daily 3.  HFrEF-continue taking Coreg 3.125 mg twice daily and furosemide 40 mg daily  2.  Labs / imaging needed at time of follow-up: CD4 count; HIV viral load; BMP; chest x-ray in 4 to 6 weeks   3.  Pending labs/ test needing follow-up: RPR; blood cultures; urine cytology ancillary; GC/chlamydia  Follow-up Appointments:  Follow-up Information     DR Denmeade Follow up.   Why: December 22, 2020 at 0840 am Contact information: : [facility] [address] [place], [place] [address]  Infectious disease        Danelle Earthly, MD Follow up.   Specialty: Internal Medicine Why: 12/24/20 at 1:45 pm. Please let us know if you cannot make this appointment. Contact information: [address], [address] [place] [address] [phone]                 Hospital Course by problem list: 1.  Multifocal pneumonia-patient presented to the ED with shortness of breath and pleuritic chest pain.  She was previously treated for pneumonia several weeks ago.  Patient states that her symptoms feel similar to her recent pneumonia infection.  Chest x-ray confirmed consolidation of the right middle and lower lobes.  Patient was started on empiric antibiotics IV vancomycin and cefepime.  Streptococcal pneumonia urine antigen positive.  Patient did not require oxygen supplementation.  IV antibiotics were completed and patient symptoms improved throughout hospital course. 2.  HIV-patient has been without antiviral medication for the past year.  On admission, CD4 count less than 200 and HIV viral load 150,000.  Patient was started on PJP prophylaxis with Bactrim; infectious disease consulted and started patient on [place]. 3.  Leukopenia-on  admission patient was found to be leukopenic, likely secondary to untreated HIV.  Patient remained afebrile throughout hospital course.  Patient was started on antivirals and prophylaxis. 4.  HFrEF-echo from September 2021 revealed LVEF of 25 to 30%, with grade 2 diastolic dysfunction, mild LV dilation; moderately reduced RV function and mild hypertrophy.  Patient appears euvolemic on admission.  No lower extremity edema and no rhonchi on lung auscultation was noted on physical exam.  Home medications include furosemide 40 mg daily and Coreg 3.125 mg twice daily.  Patient was restarted on her Coreg dosing during hospitalization.  Furosemide was held in the setting of acute kidney injury.  Patient remained euvolemic throughout hospital course. 5.  Acute kidney injury-on admission creatinine function was elevated at 1.70.  Patient did receive 1 L fluids and creatinine function continued to improve throughout hospital course.  Furosemide was held.  Fluid restriction and cardiac carb diet throughout hospital course.  Discharge Exam:   BP 123/74 (BP Location: Left Arm)   Pulse 60   Temp (!) 97.3 F (36.3 C) (Oral)   Resp 16   Ht 5\' 4"  (1.626 m)   Wt 50.2 kg   LMP 09/30/2019   SpO2 99%   BMI 19.00 kg/m  Discharge exam: Physical Exam Constitutional:      General: She is not in acute distress. HENT:     Head: Normocephalic and atraumatic.  Cardiovascular:     Rate and Rhythm: Normal rate and regular rhythm.     Heart sounds: Normal heart sounds.  Pulmonary:     Effort: Pulmonary effort is normal.     Breath sounds: Normal breath sounds. No rhonchi.  Abdominal:     General: Abdomen is flat.  Musculoskeletal:     Right lower leg: No edema.     Left lower leg: No edema.  Skin:    General: Skin is warm and dry.  Neurological:     General: No focal deficit present.     Mental Status: She is alert.  Psychiatric:        Behavior: Behavior is cooperative.     Pertinent Labs, Studies, and  Procedures:  CBC Latest Ref Rng & Units 12/14/2020 12/13/2020 10/04/2020  WBC 4.0 - 10.5 K/uL 2.3(L) 2.9(L) 4.6  Hemoglobin 12.0 - 15.0 g/dL 9.4(L) 10.2(L) 12.7  Hematocrit 36.0 - 46.0 % 29.0(L) 31.7(L) 38.5  Platelets 150 - 400 K/uL 156 178 107(L)   CMP Latest Ref Rng & Units 12/17/2020 12/16/2020 12/14/2020  Glucose 70 - 99 mg/dL 115(H) 76 93  BUN 6 - 20 mg/dL 25(H) 25(H) 15  Creatinine 0.44 - 1.00 mg/dL 1.36(H) 1.54(H) 1.44(H)  Sodium 135 - 145 mmol/L 133(L) 131(L) 133(L)  Potassium 3.5 - 5.1 mmol/L 4.1 5.0 3.6  Chloride 98 - 111 mmol/L 110 104 102  CO2 22 - 32 mmol/L 16(L) 16(L) 24  Calcium 8.9 - 10.3 mg/dL 8.1(L) 8.0(L) 7.8(L)  Total Protein 6.5 - 8.1 g/dL - - 7.1  Total Bilirubin 0.3 - 1.2 mg/dL - - 0.7  Alkaline Phos 38 - 126 U/L - - 66  AST 15 - 41 U/L - - 28  ALT 0 - 44 U/L - - 15   BMP Latest Ref Rng & Units 12/17/2020 12/16/2020 12/14/2020  Glucose 70 - 99 mg/dL 115(H) 76 93  BUN 6 - 20 mg/dL 25(H) 25(H) 15  Creatinine 0.44 - 1.00 mg/dL 1.36(H) 1.54(H) 1.44(H)  Sodium 135 - 145 mmol/L 133(L) 131(L) 133(L)  Potassium 3.5 - 5.1 mmol/L 4.1 5.0 3.6  Chloride 98 - 111 mmol/L 110 104 102  CO2 22 - 32 mmol/L 16(L) 16(L) 24  Calcium 8.9 - 10.3 mg/dL 8.1(L) 8.0(L) 7.8(L)   CHEST - 2 VIEW   COMPARISON:  Chest x-ray 10/04/2020.   FINDINGS: There is airspace disease throughout the inferior aspect of the right upper lobe and minimally in the right middle lobe. There is no pleural effusion or pneumothorax. The cardiomediastinal silhouette is within normal limits. There is a healed left eighth rib fracture. No acute fractures are seen.   IMPRESSION: 1. Right upper lobe and right middle lobe airspace disease worrisome for multifocal pneumonia. Followup PA and lateral chest X-ray is recommended in 3-4 weeks following trial of antibiotic therapy to ensure resolution and exclude underlying malignancy.    Discharge Instructions: Discharge Instructions     Call MD for:   difficulty breathing, headache or visual disturbances   Complete by: As directed    Call MD for:  extreme fatigue   Complete by: As directed    Call MD for:  hives   Complete by: As directed    Call MD for:  persistant dizziness or light-headedness   Complete by: As directed    Call MD for:  persistant nausea and vomiting   Complete by: As directed    Call MD for:  redness, tenderness, or signs of infection (pain, swelling, redness, odor or green/yellow discharge around incision site)   Complete by: As directed    Call MD for:  severe uncontrolled pain   Complete by: As directed    Call MD for:  temperature >100.4   Complete by: As directed    Diet - low sodium heart healthy   Complete by: As directed    Discharge instructions   Complete by: As directed    Please continue to take your [place] daily.  Please follow-up with infectious disease appointments.  Continue taking all of your other medications daily.  I hope you feel better and are able to situate your housing.   Increase activity slowly   Complete by: As directed        Signed: Timothy Lasso, MD 12/17/2020, 1:33 PM   Pager: [phone]

## 2020-12-17 NOTE — Progress Notes (Signed)
Patient stated that a doctor came into her room and told her she was getting discharged so she took out her own IV instead of waiting on nurse to remove it and for discharged orders to be put in the computer for her. Applied pressure to bleeding for 5 mins and placed gauze and tape on site.

## 2020-12-17 NOTE — Care Management (Addendum)
  CSW notes patient is homeless and unfortunately at this time there is significant barriers to securing housing in the medical setting. CSW has provided patient with information on resources to follow-up with in the community once they discharge. CSW notes patient arrived to the hospital with no current residence and at this time they are medically cleared they are appropriate to discharge back to their current living situation.   Patient for discharge today. [facility] at full capacity. Patient aware. Again NCM offered shelter list, patient denied.   Scripts were sent to [facility] , medications will be delivered to patient prior to discharge.   Patient making some phone calls.   NCM can provide cab voucher to [employer] address or [employer].     1150 Provide nurse with cab voucher to [facility] [address]

## 2020-12-17 NOTE — Progress Notes (Signed)
Patient declined cab voucher and going to original place that was planned at discharge.. Pt given bus pass as requested.

## 2020-12-17 NOTE — Progress Notes (Signed)
Discharge instructions given to patient. Patient verbalized understanding of all teaching. Home medications delivered to room and taken at discharge. Cab voucher given to patient at discharge also.

## 2020-12-19 ENCOUNTER — Other Ambulatory Visit (HOSPITAL_COMMUNITY): Payer: Self-pay

## 2020-12-19 LAB — CULTURE, BLOOD (ROUTINE X 2)
Culture: NO GROWTH
Culture: NO GROWTH
Special Requests: ADEQUATE

## 2020-12-24 ENCOUNTER — Ambulatory Visit: Payer: Medicaid Other | Admitting: Internal Medicine

## 2021-02-13 ENCOUNTER — Other Ambulatory Visit (HOSPITAL_COMMUNITY): Payer: Self-pay

## 2021-07-03 IMAGING — CT CT ANGIO CHEST
3 of 7 series · 18 of 36 positions shown · IV contrast (OMNIPAQUE 350)
Comparison: None.

CLINICAL DATA: High probability for pulmonary embolism. History of
substance abuse.

EXAM:
CT ANGIOGRAPHY CHEST WITH CONTRAST
TECHNIQUE: Multidetector CT imaging of the chest was performed using the
standard protocol during bolus administration of intravenous
contrast. Multiplanar CT image reconstructions and MIPs were
obtained to evaluate the vascular anatomy.
CONTRAST:  66mL OMNIPAQUE IOHEXOL 350 MG/ML SOLN

[Series 5: thins · axial · 0.70mm/px · z∈[+693,+959]mm · 12 of 316 slices shown]
[im 25/316  lung]
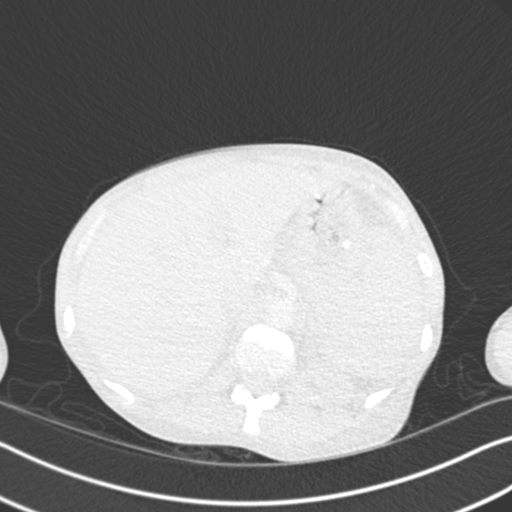
[im 49/316  mediastinal]
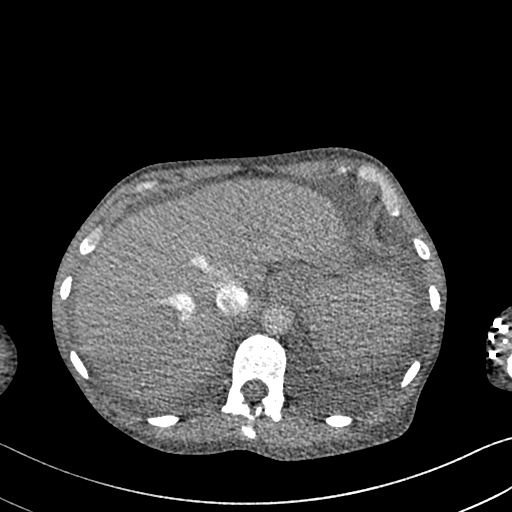
[im 73/316  lung]
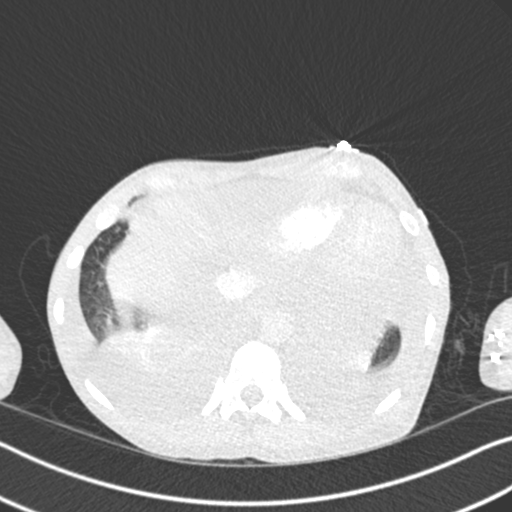
[im 97/316  mediastinal]
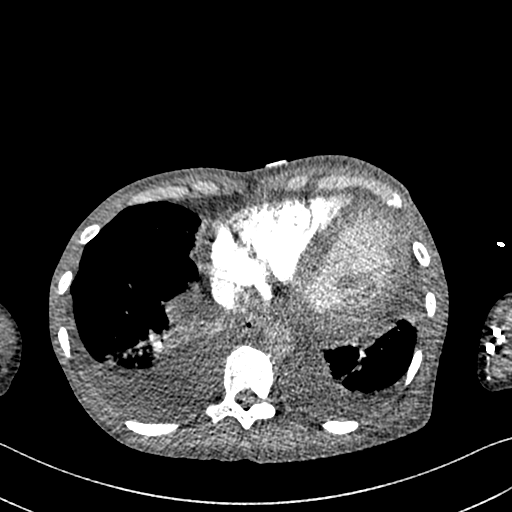
[im 122/316  lung]
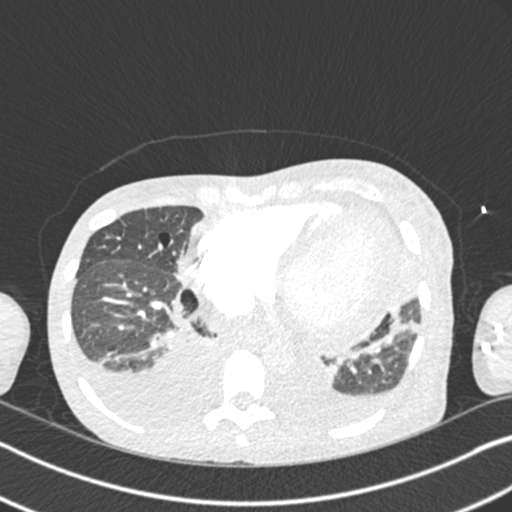
[im 146/316  mediastinal]
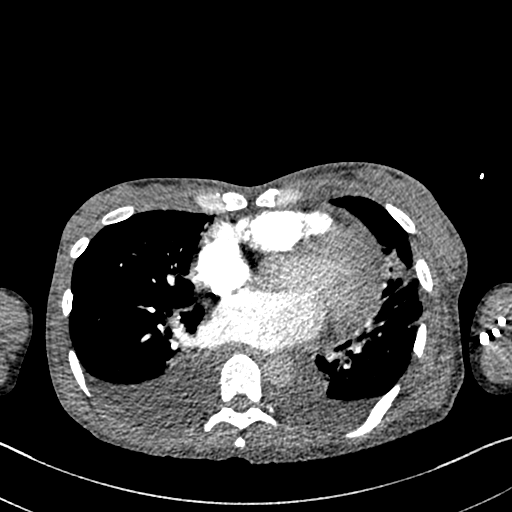
[im 170/316  lung]
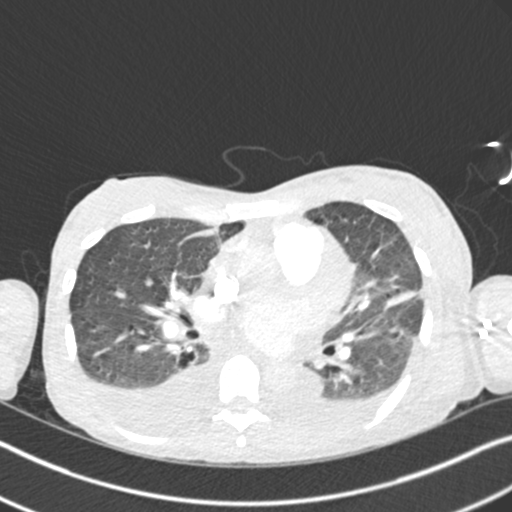
[im 194/316  mediastinal]
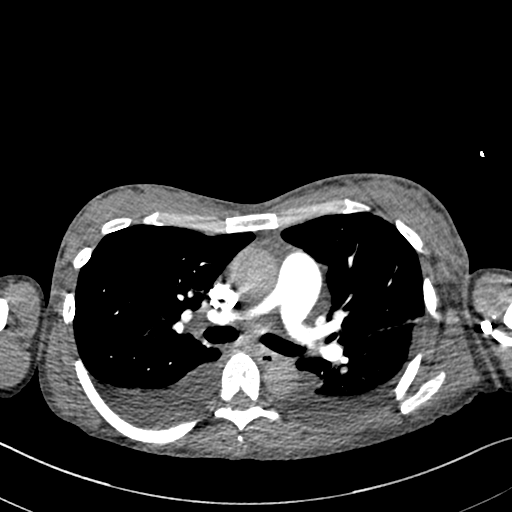
[im 219/316  lung]
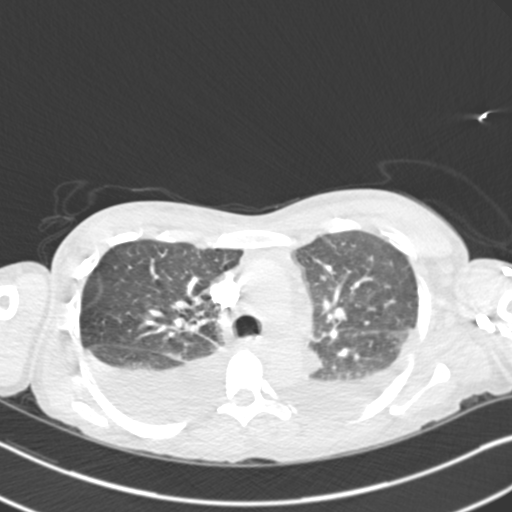
[im 243/316  mediastinal]
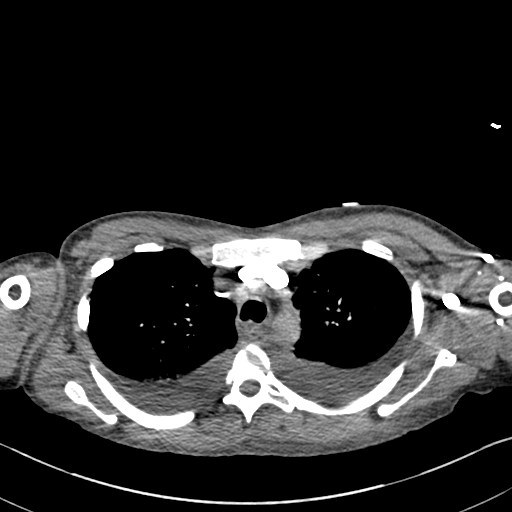
[im 267/316  lung]
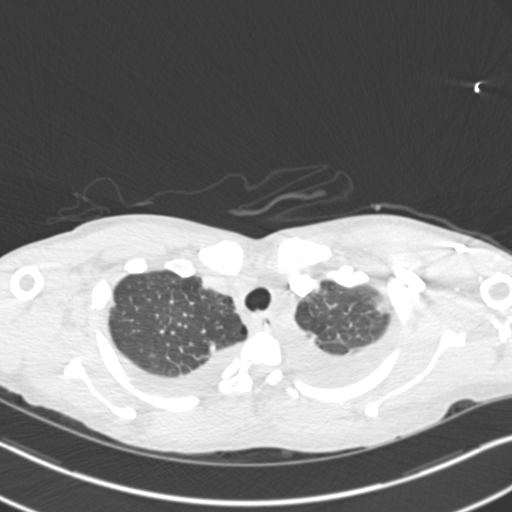
[im 291/316  mediastinal]
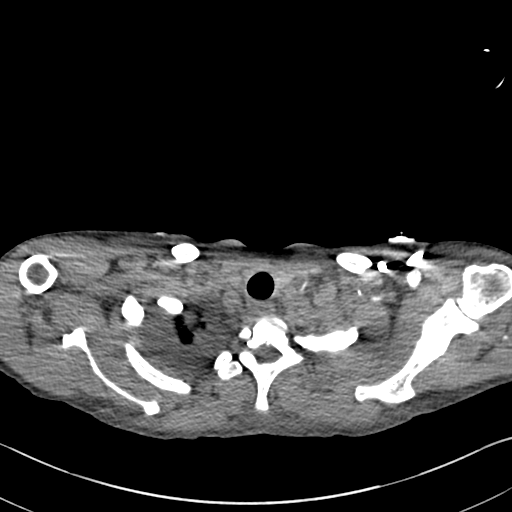

[Series 6: coronal mpr · coronal · 0.69mm/px · 1 of 109 slices shown]
[im 55/109  mediastinal]
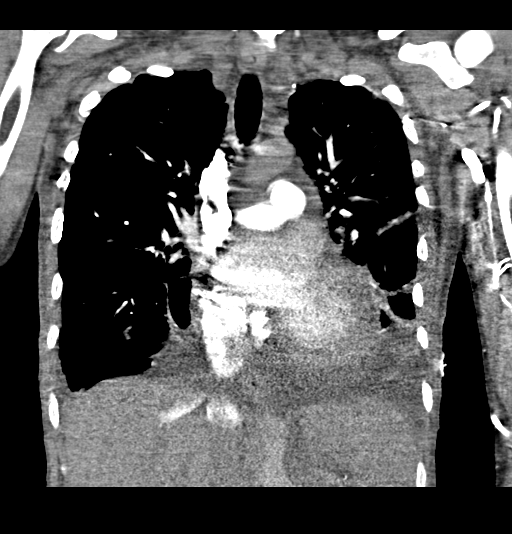

[Series 10: lung · axial · 0.70mm/px · z∈[+730,+924]mm · 5 of 147 slices shown]
[im 25/147  mediastinal]
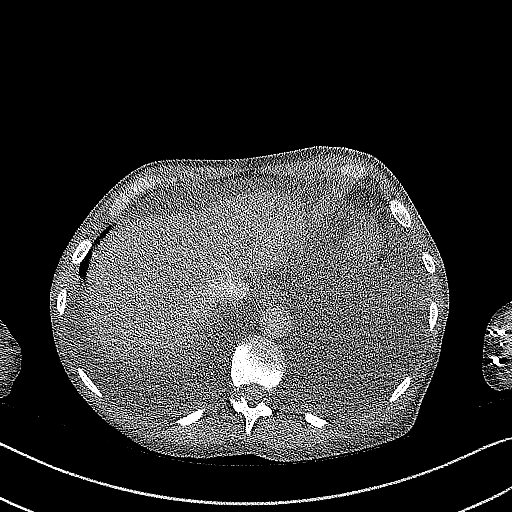
[im 49/147  mediastinal]
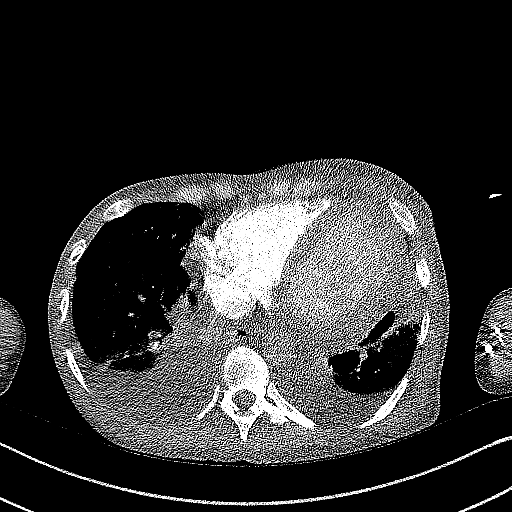
[im 74/147  mediastinal]
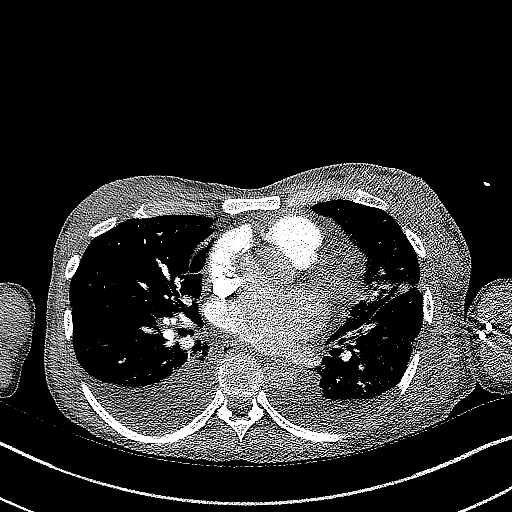
[im 98/147  mediastinal]
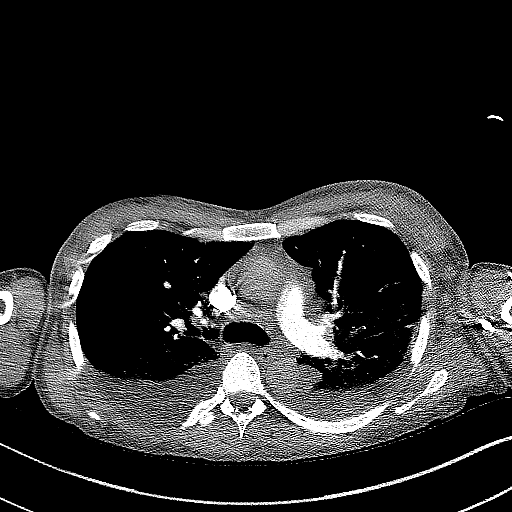
[im 122/147  mediastinal]
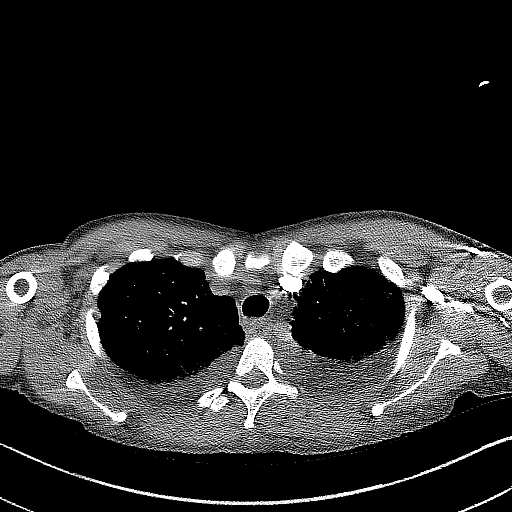

[18 of 36 positions shown; findings below may reference images not displayed]

FINDINGS: Cardiovascular: Enlarged appearance of the heart. No significant
pericardial effusion. No pulmonary artery filling defect

Mediastinum/Nodes: Negative for adenopathy or mass.

Lungs/Pleura: Moderate layering pleural effusions. Streaky opacity
in the lower lungs with volume loss more consistent with atelectasis
than consolidation. Minimal and nonspecific ground-glass opacity in
the subpleural left upper lobe. Mild generalized interlobular septal
thickening best seen on coronal reformats

Upper Abdomen: Intrahepatic venous reflux. Small volume ascites seen
about the liver.

Musculoskeletal: No acute or aggressive finding. Degenerative facet
and endplate changes seen at T11-12.

Review of the MIP images confirms the above findings.
IMPRESSION: 1. Cardiomegaly with mild interstitial edema and moderate pleural
effusions. Small volume ascites is also seen in the upper abdomen.
2. Pulmonary opacities with morphology suggesting atelectasis.
3. Negative for pulmonary embolism.

## 2022-08-30 IMAGING — CR DG CHEST 2V
2 series · 2 of 2 positions shown · non-contrast
Comparison: Chest x-ray 10/04/2020.

CLINICAL DATA: Chest pain.  Recent pneumonia diagnosis.

EXAM:
CHEST - 2 VIEW

[chest pa]
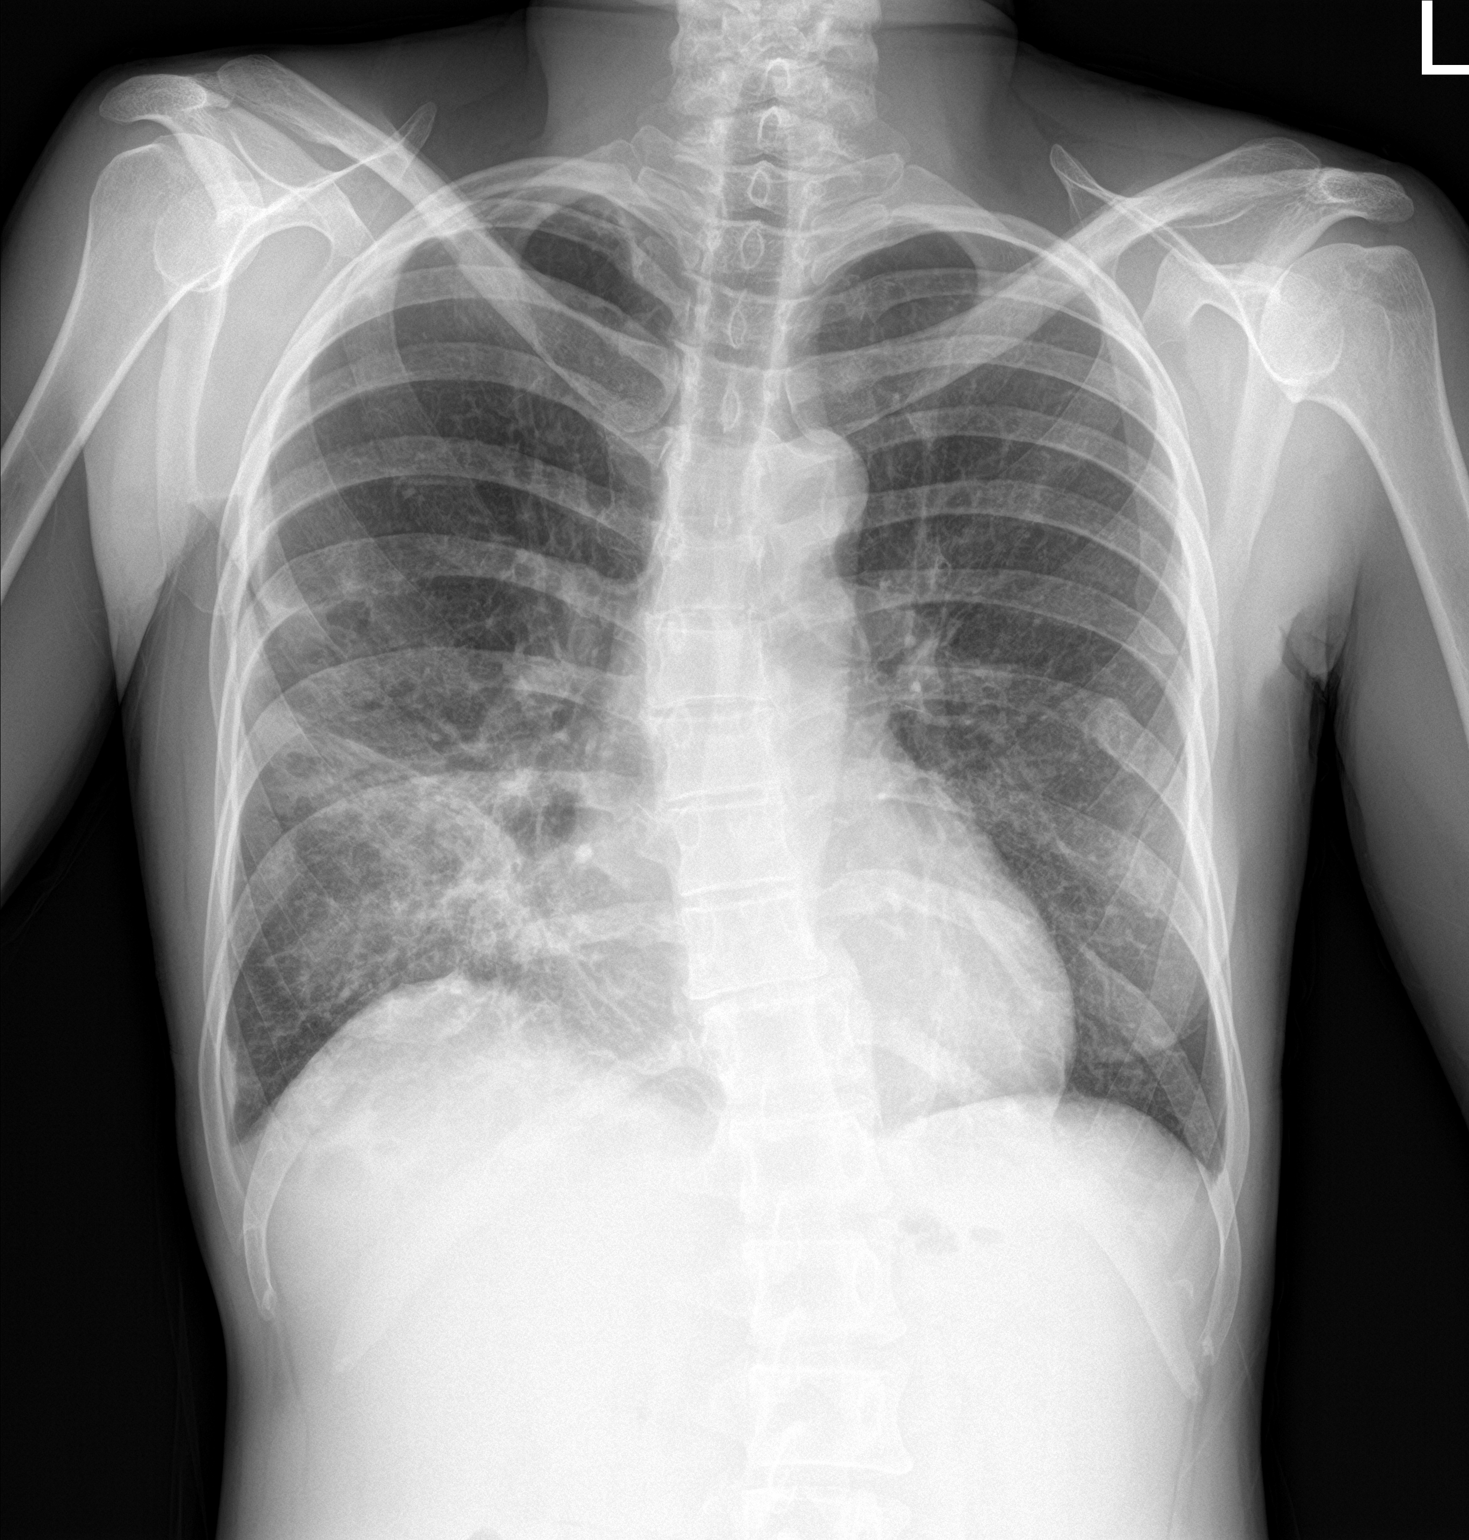

[chest lat]
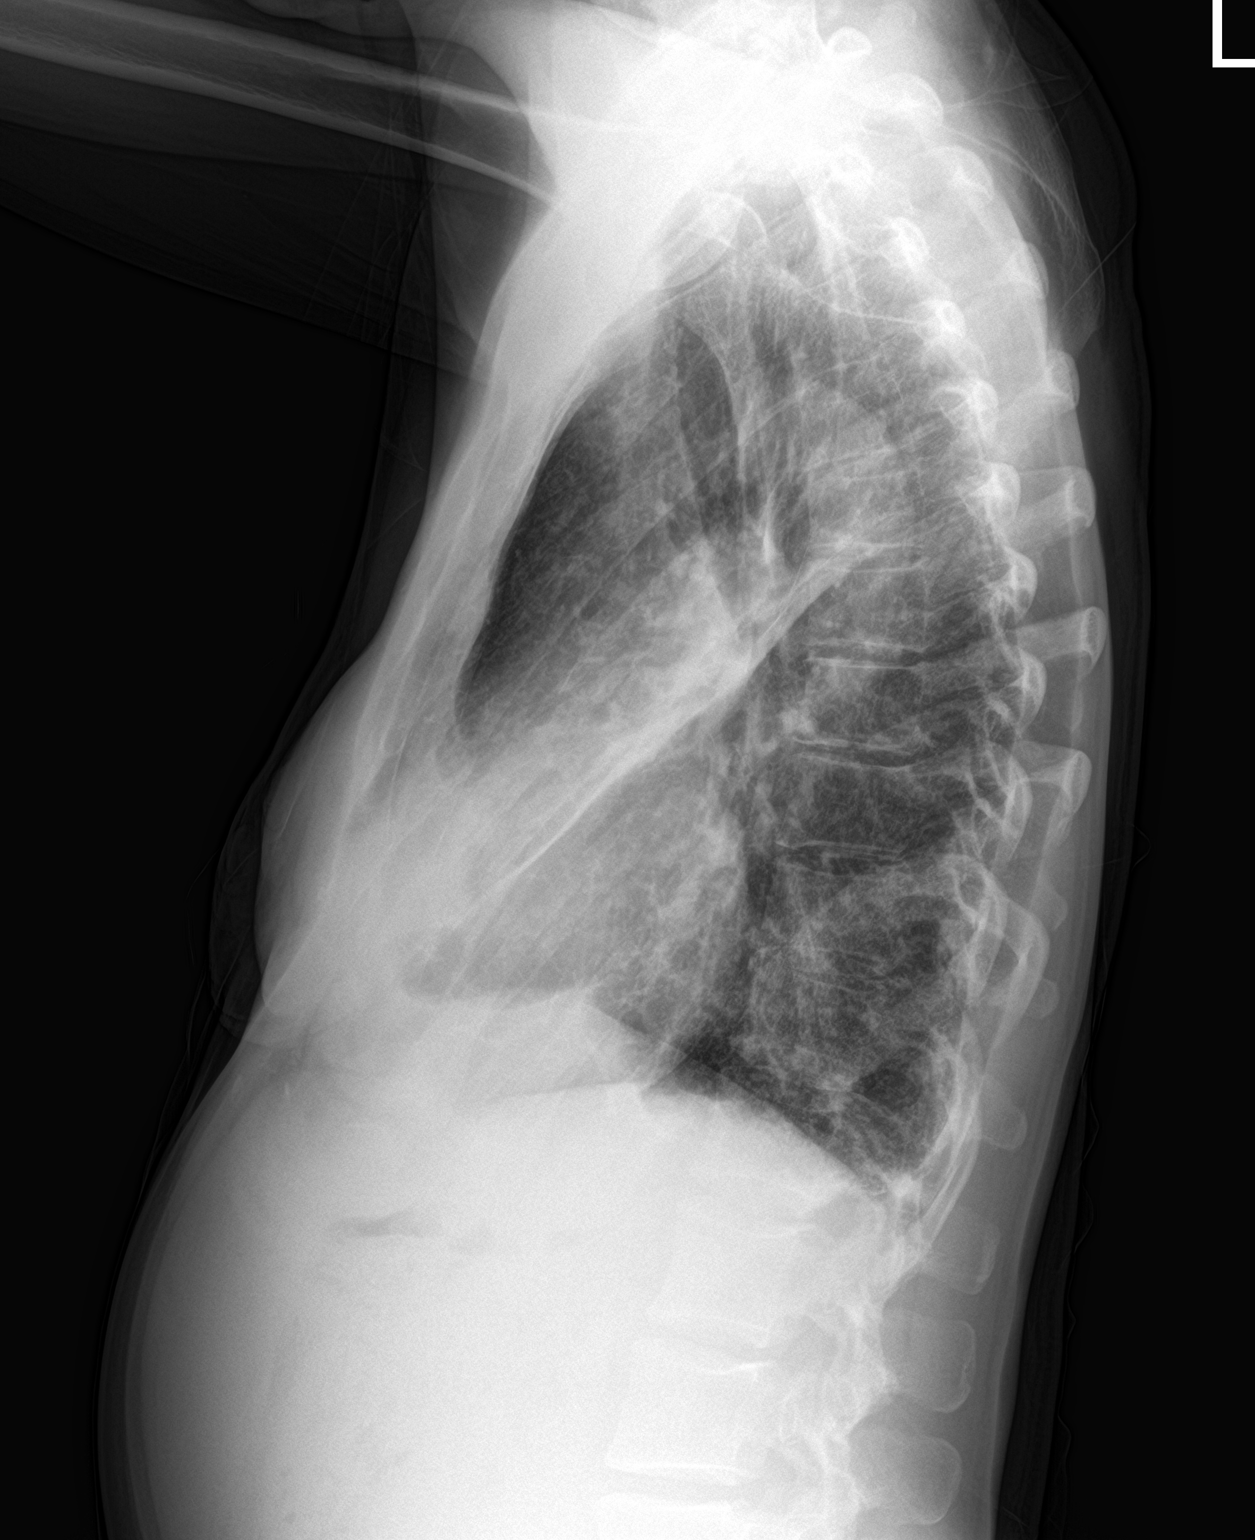

[2 of 2 positions shown; findings below may reference images not displayed]

FINDINGS: There is airspace disease throughout the inferior aspect of the
right upper lobe and minimally in the right middle lobe. There is no
pleural effusion or pneumothorax. The cardiomediastinal silhouette
is within normal limits. There is a healed left eighth rib fracture.
No acute fractures are seen.
IMPRESSION: 1. Right upper lobe and right middle lobe airspace disease worrisome
for multifocal pneumonia. Followup PA and lateral chest X-ray is
recommended in 3-4 weeks following trial of antibiotic therapy to
ensure resolution and exclude underlying malignancy.
